# Patient Record
Sex: Male | Born: 1955 | Race: White | Hispanic: No | Marital: Married | Smoking: Former smoker
Health system: Southern US, Community
[De-identification: ages and names within clinical notes are randomized; demographics above are authoritative.]

## PROBLEM LIST (undated history)

## (undated) DIAGNOSIS — I351 Nonrheumatic aortic (valve) insufficiency: Secondary | ICD-10-CM

## (undated) DIAGNOSIS — L989 Disorder of the skin and subcutaneous tissue, unspecified: Secondary | ICD-10-CM

## (undated) DIAGNOSIS — R609 Edema, unspecified: Secondary | ICD-10-CM

## (undated) DIAGNOSIS — I10 Essential (primary) hypertension: Secondary | ICD-10-CM

## (undated) DIAGNOSIS — K5730 Diverticulosis of large intestine without perforation or abscess without bleeding: Secondary | ICD-10-CM

## (undated) DIAGNOSIS — R001 Bradycardia, unspecified: Secondary | ICD-10-CM

## (undated) DIAGNOSIS — H9313 Tinnitus, bilateral: Secondary | ICD-10-CM

## (undated) HISTORY — DX: Essential (primary) hypertension: I10

## (undated) HISTORY — DX: Nonrheumatic aortic (valve) insufficiency: I35.1

## (undated) HISTORY — DX: Bradycardia, unspecified: R00.1

## (undated) HISTORY — DX: Diverticulosis of large intestine without perforation or abscess without bleeding: K57.30

## (undated) HISTORY — PX: APPENDECTOMY: SHX54

## (undated) HISTORY — PX: SQUAMOUS CELL CARCINOMA EXCISION: SHX2433

## (undated) HISTORY — DX: Tinnitus, bilateral: H93.13

## (undated) HISTORY — PX: MOLE REMOVAL: SHX2046

## (undated) HISTORY — PX: TONSILLECTOMY AND ADENOIDECTOMY: SHX28

## (undated) HISTORY — DX: Edema, unspecified: R60.9

## (undated) HISTORY — DX: Disorder of the skin and subcutaneous tissue, unspecified: L98.9

---

## 2004-04-17 ENCOUNTER — Ambulatory Visit: Payer: Self-pay | Admitting: Internal Medicine

## 2004-05-13 ENCOUNTER — Ambulatory Visit: Payer: Self-pay | Admitting: Internal Medicine

## 2004-05-17 ENCOUNTER — Ambulatory Visit: Payer: Self-pay | Admitting: Internal Medicine

## 2004-10-24 ENCOUNTER — Ambulatory Visit: Payer: Self-pay | Admitting: Internal Medicine

## 2006-07-10 ENCOUNTER — Ambulatory Visit: Payer: Self-pay | Admitting: Internal Medicine

## 2006-11-13 ENCOUNTER — Ambulatory Visit: Payer: Self-pay | Admitting: Internal Medicine

## 2006-11-13 LAB — CONVERTED CEMR LAB
ALT: 26 units/L (ref 0–53)
AST: 19 units/L (ref 0–37)
Albumin: 3.6 g/dL (ref 3.5–5.2)
Alkaline Phosphatase: 85 units/L (ref 39–117)
BUN: 16 mg/dL (ref 6–23)
Basophils Absolute: 0 10*3/uL (ref 0.0–0.1)
Basophils Relative: 0.6 % (ref 0.0–1.0)
Bilirubin, Direct: 0.2 mg/dL (ref 0.0–0.3)
Blood in Urine, dipstick: NEGATIVE
CO2: 29 meq/L (ref 19–32)
Calcium: 9.4 mg/dL (ref 8.4–10.5)
Chloride: 105 meq/L (ref 96–112)
Cholesterol: 157 mg/dL (ref 0–200)
Creatinine, Ser: 1.1 mg/dL (ref 0.4–1.5)
Eosinophils Absolute: 0.2 10*3/uL (ref 0.0–0.6)
Eosinophils Relative: 3.3 % (ref 0.0–5.0)
GFR calc Af Amer: 91 mL/min
GFR calc non Af Amer: 75 mL/min
Glucose, Bld: 93 mg/dL (ref 70–99)
Glucose, Urine, Semiquant: NEGATIVE
HCT: 44.3 % (ref 39.0–52.0)
HDL: 31.3 mg/dL — ABNORMAL LOW (ref >39.0)
Hemoglobin: 15.5 g/dL (ref 13.0–17.0)
Hep B Core Total Ab: NEGATIVE
Hep B S Ab: POSITIVE — AB
Hepatitis B Surface Ag: NEGATIVE
Ketones, urine, test strip: NEGATIVE
LDL Cholesterol: 116 mg/dL — ABNORMAL HIGH (ref 0–99)
Lymphocytes Relative: 25 % (ref 12.0–46.0)
MCHC: 35.1 g/dL (ref 30.0–36.0)
MCV: 93.4 fL (ref 78.0–100.0)
Monocytes Absolute: 0.4 10*3/uL (ref 0.2–0.7)
Monocytes Relative: 7.4 % (ref 3.0–11.0)
Neutro Abs: 3.2 10*3/uL (ref 1.4–7.7)
Neutrophils Relative %: 63.7 % (ref 43.0–77.0)
Nitrite: NEGATIVE
PSA: 0.68 ng/mL (ref 0.10–4.00)
Platelets: 205 10*3/uL (ref 150–400)
Potassium: 4.3 meq/L (ref 3.5–5.1)
RBC: 4.74 M/uL (ref 4.22–5.81)
RDW: 12.2 % (ref 11.5–14.6)
Sodium: 141 meq/L (ref 135–145)
Specific Gravity, Urine: >=1.03
TSH: 1.72 microintl units/mL (ref 0.35–5.50)
Testosterone: 421.23 ng/dL (ref 350.00–890)
Total Bilirubin: 0.9 mg/dL (ref 0.3–1.2)
Total CHOL/HDL Ratio: 5
Total Protein: 6.4 g/dL (ref 6.0–8.3)
Triglycerides: 49 mg/dL (ref 0–149)
Urobilinogen, UA: 0.2
VLDL: 10 mg/dL (ref 0–40)
WBC Urine, dipstick: NEGATIVE
WBC: 5 10*3/uL (ref 4.5–10.5)
pH: 5

## 2006-11-20 ENCOUNTER — Ambulatory Visit: Payer: Self-pay | Admitting: Internal Medicine

## 2006-11-20 DIAGNOSIS — R413 Other amnesia: Secondary | ICD-10-CM | POA: Insufficient documentation

## 2006-12-04 ENCOUNTER — Ambulatory Visit: Payer: Self-pay | Admitting: Gastroenterology

## 2006-12-14 ENCOUNTER — Encounter: Payer: Self-pay | Admitting: Internal Medicine

## 2006-12-14 ENCOUNTER — Ambulatory Visit: Payer: Self-pay | Admitting: Gastroenterology

## 2006-12-14 HISTORY — PX: COLONOSCOPY: SHX174

## 2007-01-09 ENCOUNTER — Encounter: Admission: RE | Admit: 2007-01-09 | Discharge: 2007-01-09 | Payer: Self-pay | Admitting: Neurology

## 2007-04-01 ENCOUNTER — Telehealth: Payer: Self-pay | Admitting: Internal Medicine

## 2007-04-01 ENCOUNTER — Ambulatory Visit: Payer: Self-pay | Admitting: Internal Medicine

## 2007-04-01 DIAGNOSIS — M25579 Pain in unspecified ankle and joints of unspecified foot: Secondary | ICD-10-CM | POA: Insufficient documentation

## 2008-01-06 ENCOUNTER — Ambulatory Visit: Payer: Self-pay | Admitting: Internal Medicine

## 2008-01-06 DIAGNOSIS — R109 Unspecified abdominal pain: Secondary | ICD-10-CM | POA: Insufficient documentation

## 2008-01-06 LAB — CONVERTED CEMR LAB
Rubeola IgG: 2.73 — ABNORMAL HIGH
Varicella IgG: 5.55 — ABNORMAL HIGH

## 2008-01-07 LAB — CONVERTED CEMR LAB
AST: 23 units/L (ref 0–37)
Albumin: 3.8 g/dL (ref 3.5–5.2)
Alkaline Phosphatase: 81 units/L (ref 39–117)
Amylase: 58 units/L (ref 27–131)
Bilirubin, Direct: 0.1 mg/dL (ref 0.0–0.3)
CO2: 30 meq/L (ref 19–32)
Creatinine, Ser: 1.2 mg/dL (ref 0.4–1.5)
Eosinophils Absolute: 0.1 10*3/uL (ref 0.0–0.7)
GFR calc non Af Amer: 68 mL/min
Monocytes Relative: 7.8 % (ref 3.0–12.0)
Neutrophils Relative %: 65.1 % (ref 43.0–77.0)
Potassium: 4.1 meq/L (ref 3.5–5.1)
RDW: 12.2 % (ref 11.5–14.6)
Sodium: 142 meq/L (ref 135–145)
Total Bilirubin: 0.6 mg/dL (ref 0.3–1.2)
WBC: 4.6 10*3/uL (ref 4.5–10.5)

## 2008-01-11 ENCOUNTER — Ambulatory Visit: Payer: Self-pay | Admitting: Internal Medicine

## 2008-12-25 ENCOUNTER — Ambulatory Visit: Payer: Self-pay | Admitting: Internal Medicine

## 2008-12-25 DIAGNOSIS — K5730 Diverticulosis of large intestine without perforation or abscess without bleeding: Secondary | ICD-10-CM | POA: Insufficient documentation

## 2011-02-13 ENCOUNTER — Ambulatory Visit (INDEPENDENT_AMBULATORY_CARE_PROVIDER_SITE_OTHER): Payer: BC Managed Care – PPO | Admitting: Family

## 2011-02-13 ENCOUNTER — Encounter: Payer: Self-pay | Admitting: Family

## 2011-02-13 VITALS — BP 118/78 | HR 67 | Resp 14 | Wt 199.0 lb

## 2011-02-13 DIAGNOSIS — H9319 Tinnitus, unspecified ear: Secondary | ICD-10-CM

## 2011-02-13 DIAGNOSIS — H9311 Tinnitus, right ear: Secondary | ICD-10-CM

## 2011-02-13 NOTE — Progress Notes (Signed)
  Subjective:    Patient ID: Andrew Mckay, male    DOB: 02/04/1955, 56 y.o.   MRN: [id number]  HPI 56 year old white male, nonsmoker, patient of doctors for is in today with complaints of ringing in his ears has been consistent since 01/26/2011. He is unsure of its both ears but believes history right ear is ringing. He has an occasional headache that is dull, and annoying, rates it a 2/10 that comes and goes but is unsure if the headache is associated with ringing in ears. He denies any sneezing, cough, congestion, vertigo, lightheadedness, and dizziness. No past medical history of any ear issues as an adult. As a child he had frequent otitis media.   Review of Systems  Constitutional: Negative.   HENT: Positive for tinnitus. Negative for hearing loss, congestion, rhinorrhea, sneezing, postnasal drip and ear discharge.   Eyes: Negative.   Respiratory: Negative.   Cardiovascular: Negative.   Musculoskeletal: Negative.   Skin: Negative.   Neurological: Negative.  Negative for dizziness, weakness, light-headedness and numbness.  Hematological: Negative.   Psychiatric/Behavioral: Negative.    No past medical history on file.  History   Social History  . Marital Status: Married    Spouse Name: N/A    Number of Children: N/A  . Years of Education: N/A   Occupational History  . Not on file.   Social History Main Topics  . Smoking status: Former Games developer  . Smokeless tobacco: Not on file  . Alcohol Use: Not on file  . Drug Use: Not on file  . Sexually Active: Not on file   Other Topics Concern  . Not on file   Social History Narrative  . No narrative on file    No past surgical history on file.  No family history on file.  No Known Allergies  No current outpatient prescriptions on file prior to visit.    BP 118/78  Pulse 67  Resp 14  Wt 199 lb (90.266 kg)  SpO2 96%chart    Objective:   Physical Exam  Constitutional: He is oriented to person, place, and time. He  appears well-developed and well-nourished.  HENT:  Right Ear: External ear normal.  Nose: Nose normal.  Mouth/Throat: Oropharynx is clear and moist.       Small amount of fluid noted to the left tympanic membrane  Eyes: Conjunctivae are normal.  Neck: Normal range of motion. Neck supple.  Cardiovascular: Normal rate, regular rhythm and normal heart sounds.   Pulmonary/Chest: Effort normal and breath sounds normal.  Musculoskeletal: Normal range of motion.  Neurological: He is alert and oriented to person, place, and time.  Skin: Skin is warm and dry.  Psychiatric: He has a normal mood and affect.          Assessment & Plan:  Assessment: Tinnitus, eustachian tube dysfunction  Plan: Zyrtec-D or Claritin-D twice daily. Patient will call the office in one week to let us know if the ringing has subsided. I suspect that this is an inner ear problem, will probably need referral to ENT for further management. We'll follow the patient in a week and make a decision from there.

## 2011-02-13 NOTE — Patient Instructions (Signed)
1. Claritin-D or Zyrtec-D twice daily. 2. Call the office if ringing continues in 1 week.   Tinnitus Sounds you hear in your ears and coming from within the ear is called tinnitus. This can be a symptom of many ear disorders. It is often associated with hearing loss.  Tinnitus can be seen with:  Infections.   Ear blockages such as wax buildup.   Meniere's disease.   Ear damage.   Inherited.   Occupational causes.  While irritating, it is not usually a threat to health. When the cause of the tinnitus is wax, infection in the middle ear, or foreign body it is easily treated. Hearing loss will usually be reversible.  TREATMENT  When treating the underlying cause does not get rid of tinnitus, it may be necessary to get rid of the unwanted sound by covering it up with more pleasant background noises. This may include music, the radio etc. There are tinnitus maskers which can be worn which produce background noise to cover up the tinnitus. Avoid all medications which tend to make tinnitus worse such as alcohol, caffeine, aspirin, and nicotine. There are many soothing background tapes such as rain, ocean, thunderstorms, etc. These soothing sounds help with sleeping or resting. Keep all follow-up appointments and referrals. This is important to identify the cause of the problem. It also helps avoid complications, impaired hearing, disability, or chronic pain. Document Released: 01/06/2005 Document Revised: 09/18/2010 Document Reviewed: 08/25/2007 [facility] Patient Information 2012 [place], [place].

## 2011-03-13 ENCOUNTER — Ambulatory Visit (INDEPENDENT_AMBULATORY_CARE_PROVIDER_SITE_OTHER): Payer: BC Managed Care – PPO | Admitting: Internal Medicine

## 2011-03-13 ENCOUNTER — Encounter: Payer: Self-pay | Admitting: Internal Medicine

## 2011-03-13 VITALS — BP 122/80 | HR 64 | Temp 98.2°F | Ht 74.0 in | Wt 198.0 lb

## 2011-03-13 DIAGNOSIS — Z23 Encounter for immunization: Secondary | ICD-10-CM

## 2011-03-13 DIAGNOSIS — Z0000 Encounter for general adult medical examination without abnormal findings: Secondary | ICD-10-CM

## 2011-03-13 LAB — LIPID PANEL
Cholesterol: 144 mg/dL (ref 0–200)
Triglycerides: 92 mg/dL (ref 0.0–149.0)
VLDL: 18.4 mg/dL (ref 0.0–40.0)

## 2011-03-13 LAB — HEPATIC FUNCTION PANEL
ALT: 26 U/L (ref 0–53)
Albumin: 3.8 g/dL (ref 3.5–5.2)
Alkaline Phosphatase: 80 U/L (ref 39–117)
Total Bilirubin: 0.5 mg/dL (ref 0.3–1.2)

## 2011-03-13 LAB — POCT URINALYSIS DIPSTICK
Bilirubin, UA: NEGATIVE
Blood, UA: NEGATIVE
Glucose, UA: NEGATIVE
Ketones, UA: NEGATIVE
Leukocytes, UA: NEGATIVE
Nitrite, UA: NEGATIVE
Spec Grav, UA: >=1.03
Urobilinogen, UA: 0.2
pH, UA: 5.5

## 2011-03-13 LAB — CBC WITH DIFFERENTIAL/PLATELET
Basophils Absolute: 0 10*3/uL (ref 0.0–0.1)
Hemoglobin: 15.7 g/dL (ref 13.0–17.0)
MCHC: 34.7 g/dL (ref 30.0–36.0)
MCV: 94 fl (ref 78.0–100.0)
Monocytes Absolute: 0.4 10*3/uL (ref 0.1–1.0)
Neutro Abs: 3.1 10*3/uL (ref 1.4–7.7)
Neutrophils Relative %: 66.1 % (ref 43.0–77.0)
RDW: 14 % (ref 11.5–14.6)
WBC: 4.7 10*3/uL (ref 4.5–10.5)

## 2011-03-13 LAB — BASIC METABOLIC PANEL
Calcium: 9 mg/dL (ref 8.4–10.5)
Glucose, Bld: 89 mg/dL (ref 70–99)
Potassium: 4.5 mEq/L (ref 3.5–5.1)
Sodium: 143 mEq/L (ref 135–145)

## 2011-03-13 LAB — PSA: PSA: 1.81 ng/mL (ref 0.10–4.00)

## 2011-03-13 LAB — TSH: TSH: 1.72 u[IU]/mL (ref 0.35–5.50)

## 2011-03-13 NOTE — Progress Notes (Signed)
  Subjective:    Patient ID: Andrew Mckay, male    DOB: 1955-07-07, 56 y.o.   MRN: [id number]  HPI  cpx  Past Medical History  Diagnosis Date  . Diverticulosis of colon    No past surgical history on file.  reports that he has quit smoking. He does not have any smokeless tobacco history on file. His alcohol and drug histories not on file. family history is not on file.  He is adopted. No Known Allergies   Review of Systems  patient denies chest pain, shortness of breath, orthopnea. Denies lower extremity edema, abdominal pain, change in appetite, change in bowel movements. Patient denies rashes, musculoskeletal complaints. No other specific complaints in a complete review of systems.      Objective:   Physical Exam Well-developed male in no acute distress. HEENT exam atraumatic, normocephalic, extraocular muscles are intact. Conjunctivae are pink without exudate. Neck is supple without lymphadenopathy, thyromegaly, jugular venous distention. Chest is clear to auscultation without increased work of breathing. Cardiac exam S1-S2 are regular. The PMI is normal. No significant murmurs or gallops. Abdominal exam active bowel sounds, soft, nontender. No abdominal bruits. Extremities no clubbing cyanosis or edema. Peripheral pulses are normal without bruits. Neurologic exam alert and oriented without any motor or sensory deficits. Rectal exam normal tone prostate normal size without masses or asymmetry.     Assessment & Plan:  Well visit: health maint UTD tdap

## 2011-03-15 ENCOUNTER — Emergency Department (HOSPITAL_COMMUNITY)
Admission: EM | Admit: 2011-03-15 | Discharge: 2011-03-15 | Disposition: A | Discharge status: Home Health | Payer: BC Managed Care – PPO | Attending: Emergency Medicine | Admitting: Emergency Medicine

## 2011-03-15 ENCOUNTER — Emergency Department (HOSPITAL_COMMUNITY): Payer: BC Managed Care – PPO

## 2011-03-15 ENCOUNTER — Encounter (HOSPITAL_COMMUNITY): Payer: Self-pay | Admitting: Emergency Medicine

## 2011-03-15 DIAGNOSIS — S61409A Unspecified open wound of unspecified hand, initial encounter: Secondary | ICD-10-CM | POA: Insufficient documentation

## 2011-03-15 DIAGNOSIS — W260XXA Contact with knife, initial encounter: Secondary | ICD-10-CM | POA: Insufficient documentation

## 2011-03-15 DIAGNOSIS — S61419A Laceration without foreign body of unspecified hand, initial encounter: Secondary | ICD-10-CM

## 2011-03-15 MED ORDER — CEPHALEXIN 500 MG PO CAPS: 500.0000 mg | ORAL_CAPSULE | Freq: Four times a day (QID) | ORAL | Status: AC

## 2011-03-15 MED ORDER — KETOROLAC TROMETHAMINE 60 MG/2ML IM SOLN
INTRAMUSCULAR | Status: AC
  Filled 2011-03-15: qty 2

## 2011-03-15 MED ORDER — HYDROCODONE-ACETAMINOPHEN 5-325 MG PO TABS: 2.0000 | ORAL_TABLET | ORAL | Status: AC | PRN

## 2011-03-15 MED ORDER — LIDOCAINE HCL 2 % IJ SOLN
INTRAMUSCULAR | Status: AC
  Filled 2011-03-15: qty 1

## 2011-03-15 NOTE — ED Notes (Signed)
Working on motorcycle at home and was using a knife and he lacerated his left 3rd & 5th fingers.

## 2011-03-15 NOTE — Discharge Instructions (Signed)
Laceration Care, Adult     A laceration is a cut or lesion that goes through all layers of the skin and into the tissue just beneath the skin.  TREATMENT   Some lacerations may not require closure. Some lacerations may not be able to be closed due to an increased risk of infection. It is important to see your caregiver as soon as possible after an injury to minimize the risk of infection and maximize the opportunity for successful closure.  If closure is appropriate, pain medicines may be given, if needed. The wound will be cleaned to help prevent infection. Your caregiver will use stitches (sutures), staples, wound glue (adhesive), or skin adhesive strips to repair the laceration. These tools bring the skin edges together to allow for faster healing and a better cosmetic outcome. However, all wounds will heal with a scar. Once the wound has healed, scarring can be minimized by covering the wound with sunscreen during the day for 1 full year.  HOME CARE INSTRUCTIONS   For sutures or staples:  · Keep the wound clean and dry.   · If you were given a bandage (dressing), you should change it at least once a day. Also, change the dressing if it becomes wet or dirty, or as directed by your caregiver.   · Wash the wound with soap and water 2 times a day. Rinse the wound off with water to remove all soap. Pat the wound dry with a clean towel.   · After cleaning, apply a thin layer of the antibiotic ointment as recommended by your caregiver. This will help prevent infection and keep the dressing from sticking.   · You may shower as usual after the first 24 hours. Do not soak the wound in water until the sutures are removed.   · Only take over-the-counter or prescription medicines for pain, discomfort, or fever as directed by your caregiver.   · Get your sutures or staples removed as directed by your caregiver.   For skin adhesive strips:  · Keep the wound clean and dry.   · Do not get the skin adhesive strips wet. You may  bathe carefully, using caution to keep the wound dry.   · If the wound gets wet, pat it dry with a clean towel.   · Skin adhesive strips will fall off on their own. You may trim the strips as the wound heals. Do not remove skin adhesive strips that are still stuck to the wound. They will fall off in time.   For wound adhesive:  · You may briefly wet your wound in the shower or bath. Do not soak or scrub the wound. Do not swim. Avoid periods of heavy perspiration until the skin adhesive has fallen off on its own. After showering or bathing, gently pat the wound dry with a clean towel.   · Do not apply liquid medicine, cream medicine, or ointment medicine to your wound while the skin adhesive is in place. This may loosen the film before your wound is healed.   · If a dressing is placed over the wound, be careful not to apply tape directly over the skin adhesive. This may cause the adhesive to be pulled off before the wound is healed.   · Avoid prolonged exposure to sunlight or tanning lamps while the skin adhesive is in place. Exposure to ultraviolet light in the first year will darken the scar.   · The skin adhesive will usually remain in place for   5 to 10 days, then naturally fall off the skin. Do not pick at the adhesive film.   You may need a tetanus shot if:  · You cannot remember when you had your last tetanus shot.   · You have never had a tetanus shot.   If you get a tetanus shot, your arm may swell, get red, and feel warm to the touch. This is common and not a problem. If you need a tetanus shot and you choose not to have one, there is a rare chance of getting tetanus. Sickness from tetanus can be serious.  SEEK MEDICAL CARE IF:   · You have redness, swelling, or increasing pain in the wound.   · You see a red line that goes away from the wound.   · You have yellowish-white fluid (pus) coming from the wound.   · You have a fever.   · You notice a bad smell coming from the wound or dressing.   · Your wound  breaks open before or after sutures have been removed.   · You notice something coming out of the wound such as wood or glass.   · Your wound is on your hand or foot and you cannot move a finger or toe.   SEEK IMMEDIATE MEDICAL CARE IF:   · Your pain is not controlled with prescribed medicine.   · You have severe swelling around the wound causing pain and numbness or a change in color in your arm, hand, leg, or foot.   · Your wound splits open and starts bleeding.   · You have worsening numbness, weakness, or loss of function of any joint around or beyond the wound.   · You develop painful lumps near the wound or on the skin anywhere on your body.   MAKE SURE YOU:   · Understand these instructions.   · Will watch your condition.   · Will get help right away if you are not doing well or get worse.   Document Released: 01/06/2005 Document Revised: 09/18/2010 Document Reviewed: 07/02/2010  [employer]® Patient Information ©2012 [employer].

## 2011-03-15 NOTE — ED Provider Notes (Signed)
History     CSN: [id number]  Arrival date & time 03/15/11  4098   First MD Initiated Contact with Patient 03/15/11 1817      Chief Complaint  Patient presents with  . Laceration    (Consider location/radiation/quality/duration/timing/severity/associated sxs/prior treatment) HPI  Pt presents to the ED with complaints of laceration to fingers on left hand. The patient was working with a knife on his motorcycle when accidentally got his 3rd, and 5th digit. The bleeding is currently controlled. He denies being on blood thinners, he denies any other complaints or injuries.  Past Medical History  Diagnosis Date  . Diverticulosis of colon     History reviewed. No pertinent past surgical history.  Family History  Problem Relation Age of Onset  . Adopted: Yes    History  Substance Use Topics  . Smoking status: Former Games developer  . Smokeless tobacco: Not on file  . Alcohol Use: Not on file      Review of Systems  All other systems reviewed and are negative.    Allergies  Review of patient's allergies indicates no known allergies.  Home Medications   Current Outpatient Rx  Name Route Sig Dispense Refill  . CEPHALEXIN 500 MG PO CAPS Oral Take 1 capsule (500 mg total) by mouth 4 (four) times daily. 40 capsule 0  . HYDROCODONE-ACETAMINOPHEN 5-325 MG PO TABS Oral Take 2 tablets by mouth every 4 (four) hours as needed for pain. 6 tablet 0    BP 134/94  Pulse 78  Temp(Src) 99.1 F (37.3 C) (Oral)  Resp 18  Ht 6\' 2"  (1.88 m)  Wt 198 lb (89.812 kg)  BMI 25.42 kg/m2  SpO2 97%  Physical Exam  Nursing note and vitals reviewed. Constitutional: He appears well-developed and well-nourished. No distress.  HENT:  Head: Normocephalic and atraumatic.  Eyes: Pupils are equal, round, and reactive to light.  Neck: Normal range of motion. Neck supple.  Cardiovascular: Normal rate and regular rhythm.   Pulmonary/Chest: Effort normal.  Abdominal: Soft.  Musculoskeletal:   Left hand: He exhibits laceration. He exhibits normal range of motion, no tenderness, normal capillary refill, no deformity and no swelling. decreased sensation (the Distal portion of the 3rd finger has decreased sentaion) noted. Normal strength noted.       Hands: Neurological: He is alert.  Skin: Skin is warm and dry.    ED Course  Procedures (including critical care time)  Labs Reviewed - No data to display Dg Hand Complete Left  03/15/2011  *RADIOLOGY REPORT*  Clinical Data: 56 year old male with laceration.  Third digit numbness.  LEFT HAND - COMPLETE 3+ VIEW  Comparison: None.  Findings: No subcutaneous gas. No radiopaque foreign body identified.  Distal radius and ulna appear intact.  Incidental watch artifact.  Carpal bone alignment preserved.  No fracture or dislocation identified.  IMPRESSION: No acute osseous abnormality identified about the left hand.  Original Report Authenticated By: Harley Hallmark, M.D.     1. Hand laceration       MDM  LACERATION REPAIR Performed by: Dorthula Matas Authorized by: Dorthula Matas Consent: Verbal consent obtained. Risks and benefits: risks, benefits and alternatives were discussed Consent given by: patient Patient identity confirmed: provided demographic data Prepped and Draped in normal sterile fashion Wound explored  Laceration Location: left hand 3rd finger PIP anteroir joint  Laceration Length: 2.5cm  No Foreign Bodies seen or palpated  Anesthesia: local infiltration  Local anesthetic: lidocaine 2% wo epinephrine  Anesthetic total: 2 ml  Irrigation method: syringe Amount of cleaning: standard  Skin closure: sutures  Number of sutures: 5  Technique: simple interrupted  Patient tolerance: Patient tolerated the procedure well with no immediate complications. Marland Kitchen   LACERATION REPAIR Performed by: Dorthula Matas Authorized by: Dorthula Matas Consent: Verbal consent obtained. Risks and benefits: risks,  benefits and alternatives were discussed Consent given by: patient Patient identity confirmed: provided demographic data Prepped and Draped in normal sterile fashion Wound explored  Laceration Location: left hand 5th finger, PIP anterior joint  Laceration Length: 1 cm  No Foreign Bodies seen or palpated  Anesthesia: local infiltration  Local anesthetic: lidocaine 2% wo epinephrine  Anesthetic total: 2 ml  Irrigation method: syringe Amount of cleaning: standard  Skin closure: sutures  Number of sutures: 4  Technique: simple interrupted  Patient tolerance: Patient tolerated the procedure well with no immediate complications.   Pt received tetanus shot two days ago during PCP visit. Pt states the knife he used was not dirty. However, antibiotic (Keflex) given for prophylaxis as he was working with dirty metal. Finger splint placed on 3rd digit. Pt given 5mg  Lortab (6 pills for pain), pt given referral to hand with Dr. Izora Ribas.       Dorthula Matas, PA 03/15/11 2024

## 2011-03-16 NOTE — ED Provider Notes (Signed)
Medical screening examination/treatment/procedure(s) were performed by non-physician practitioner and as supervising physician I was immediately available for consultation/collaboration.  Arleigh Dicola R. Folashade Gamboa, MD 03/16/11 0008 

## 2012-04-05 ENCOUNTER — Other Ambulatory Visit (INDEPENDENT_AMBULATORY_CARE_PROVIDER_SITE_OTHER): Payer: Self-pay

## 2012-04-05 DIAGNOSIS — Z1159 Encounter for screening for other viral diseases: Secondary | ICD-10-CM

## 2012-04-05 DIAGNOSIS — Z0000 Encounter for general adult medical examination without abnormal findings: Secondary | ICD-10-CM

## 2012-04-05 NOTE — Addendum Note (Signed)
Addended by: Alfred Levins D on: 04/05/2012 04:09 PM   Modules accepted: Orders

## 2012-04-06 LAB — MEASLES/MUMPS/RUBELLA IMMUNITY
Rubella: 5.5 Index — ABNORMAL HIGH (ref <0.90)
Rubeola IgG: >300 AU/mL — ABNORMAL HIGH (ref <25.00)

## 2012-04-07 LAB — VARICELLA ZOSTER ANTIBODY, IGM: Varicella Zoster Ab IgM: 0.13 {ISR} (ref <0.91)

## 2012-04-08 LAB — TB SKIN TEST: TB Skin Test: NEGATIVE

## 2012-05-10 ENCOUNTER — Other Ambulatory Visit (INDEPENDENT_AMBULATORY_CARE_PROVIDER_SITE_OTHER): Payer: BC Managed Care – PPO

## 2012-05-10 DIAGNOSIS — Z0000 Encounter for general adult medical examination without abnormal findings: Secondary | ICD-10-CM

## 2012-05-10 LAB — PSA: PSA: 6.28 ng/mL — ABNORMAL HIGH (ref 0.10–4.00)

## 2012-05-10 LAB — HEPATIC FUNCTION PANEL
ALT: 28 U/L (ref 0–53)
AST: 20 U/L (ref 0–37)
Bilirubin, Direct: 0.1 mg/dL (ref 0.0–0.3)
Total Protein: 6.3 g/dL (ref 6.0–8.3)

## 2012-05-10 LAB — POCT URINALYSIS DIPSTICK
Bilirubin, UA: NEGATIVE
Ketones, UA: NEGATIVE
Leukocytes, UA: NEGATIVE
Spec Grav, UA: >=1.03
Urobilinogen, UA: 0.2
pH, UA: 5.5

## 2012-05-10 LAB — CBC WITH DIFFERENTIAL/PLATELET
Basophils Absolute: 0 10*3/uL (ref 0.0–0.1)
Eosinophils Absolute: 0.3 10*3/uL (ref 0.0–0.7)
MCHC: 34.9 g/dL (ref 30.0–36.0)
MCV: 91.5 fl (ref 78.0–100.0)
Monocytes Absolute: 0.4 10*3/uL (ref 0.1–1.0)
Neutro Abs: 3 10*3/uL (ref 1.4–7.7)
Platelets: 196 10*3/uL (ref 150.0–400.0)
RDW: 13.6 % (ref 11.5–14.6)

## 2012-05-10 LAB — BASIC METABOLIC PANEL
BUN: 18 mg/dL (ref 6–23)
CO2: 25 mEq/L (ref 19–32)
Calcium: 8.7 mg/dL (ref 8.4–10.5)
Chloride: 106 mEq/L (ref 96–112)
GFR: 65.79 mL/min (ref >60.00)
Potassium: 4.7 mEq/L (ref 3.5–5.1)

## 2012-05-10 LAB — LIPID PANEL: HDL: 31.7 mg/dL — ABNORMAL LOW (ref >39.00)

## 2012-05-10 LAB — TSH: TSH: 1.35 u[IU]/mL (ref 0.35–5.50)

## 2012-05-13 ENCOUNTER — Other Ambulatory Visit: Payer: Self-pay | Admitting: *Deleted

## 2012-05-13 MED ORDER — CIPROFLOXACIN HCL 500 MG PO TABS: 500.0000 mg | ORAL_TABLET | Freq: Two times a day (BID) | ORAL | Status: DC

## 2012-05-17 ENCOUNTER — Ambulatory Visit (INDEPENDENT_AMBULATORY_CARE_PROVIDER_SITE_OTHER): Payer: BC Managed Care – PPO | Admitting: Internal Medicine

## 2012-05-17 ENCOUNTER — Encounter: Payer: Self-pay | Admitting: Internal Medicine

## 2012-05-17 VITALS — BP 134/90 | HR 80 | Temp 98.0°F | Ht 74.5 in | Wt 200.0 lb

## 2012-05-17 DIAGNOSIS — N509 Disorder of male genital organs, unspecified: Secondary | ICD-10-CM

## 2012-05-17 DIAGNOSIS — Z0000 Encounter for general adult medical examination without abnormal findings: Secondary | ICD-10-CM

## 2012-05-17 DIAGNOSIS — N50811 Right testicular pain: Secondary | ICD-10-CM

## 2012-05-17 DIAGNOSIS — R972 Elevated prostate specific antigen [PSA]: Secondary | ICD-10-CM | POA: Insufficient documentation

## 2012-05-17 NOTE — Progress Notes (Signed)
Patient ID: Andrew Mckay, male   DOB: February 03, 1955, 57 y.o.   MRN: [id number] cpx  New complaints-- right sided abdominal pain for 3 months-- started with RLQ pain with radiation to right testicle. More recently pain has extended to periumbilical area.   Past Medical History  Diagnosis Date  . Diverticulosis of colon     History   Social History  . Marital Status: Married    Spouse Name: N/A    Number of Children: N/A  . Years of Education: N/A   Occupational History  . Not on file.   Social History Main Topics  . Smoking status: Former Games developer  . Smokeless tobacco: Not on file  . Alcohol Use: Not on file  . Drug Use: Not on file  . Sexually Active: Not on file   Other Topics Concern  . Not on file   Social History Narrative  . No narrative on file    No past surgical history on file.  Family History  Problem Relation Age of Onset  . Adopted: Yes    No Known Allergies  Current Outpatient Prescriptions on File Prior to Visit  Medication Sig Dispense Refill  . ciprofloxacin (CIPRO) 500 MG tablet Take 1 tablet (500 mg total) by mouth 2 (two) times daily.  20 tablet  0   No current facility-administered medications on file prior to visit.     patient denies chest pain, shortness of breath, orthopnea. Denies lower extremity edema, abdominal pain, change in appetite, change in bowel movements. Patient denies rashes, musculoskeletal complaints. No other specific complaints in a complete review of systems.   BP 134/90  Pulse 80  Temp(Src) 98 F (36.7 C) (Oral)  Ht 6' 2.5" (1.892 m)  Wt 200 lb (90.719 kg)  BMI 25.34 kg/m2   Well-developed male in no acute distress. HEENT exam atraumatic, normocephalic, extraocular muscles are intact. Conjunctivae are pink without exudate. Neck is supple without lymphadenopathy, thyromegaly, jugular venous distention. Chest is clear to auscultation without increased work of breathing. Cardiac exam S1-S2 are regular. The PMI is normal.  No significant murmurs or gallops. Abdominal exam active bowel sounds, soft, nontender. No abdominal bruits. Extremities no clubbing cyanosis or edema. Peripheral pulses are normal without bruits. Neurologic exam alert and oriented without any motor or sensory deficits. Rectal exam normal tone prostate enlarged without masses or asymmetry. No obvious hernia but right testicle is elevated.  Well visit- health maint UTD  RLQ pain-- suspect related to abnormal testicle position-- refer urology  Elevated psa-- cipro - recheck in 30 days.

## 2012-05-24 DIAGNOSIS — R1031 Right lower quadrant pain: Secondary | ICD-10-CM | POA: Insufficient documentation

## 2012-05-27 ENCOUNTER — Other Ambulatory Visit: Payer: BC Managed Care – PPO

## 2012-06-04 ENCOUNTER — Encounter: Payer: BC Managed Care – PPO | Admitting: Internal Medicine

## 2012-06-16 ENCOUNTER — Other Ambulatory Visit (INDEPENDENT_AMBULATORY_CARE_PROVIDER_SITE_OTHER): Payer: BC Managed Care – PPO

## 2012-06-16 DIAGNOSIS — R972 Elevated prostate specific antigen [PSA]: Secondary | ICD-10-CM

## 2012-06-29 ENCOUNTER — Encounter: Payer: Self-pay | Admitting: Internal Medicine

## 2012-06-29 ENCOUNTER — Ambulatory Visit (INDEPENDENT_AMBULATORY_CARE_PROVIDER_SITE_OTHER): Payer: BC Managed Care – PPO | Admitting: Internal Medicine

## 2012-06-29 VITALS — BP 146/87 | Temp 98.7°F | Wt 197.0 lb

## 2012-06-29 DIAGNOSIS — R1031 Right lower quadrant pain: Secondary | ICD-10-CM

## 2012-07-01 NOTE — Assessment & Plan Note (Signed)
Unclear etiology He needs further evaluation for progressive pain i worry about a mass, possible kidney stone, could be "walled" off abscess.  Given the duration he needs further evaluation

## 2012-07-01 NOTE — Progress Notes (Signed)
Patient ID: Andrew Mckay, male   DOB: 1956-01-14, 57 y.o.   MRN: [id number] Now several weeks of severe (at times) RLQ abdominal pain. Pain is intermittent and limited to RLQ. No fever but at times diaphoretic.  No hematuria, no gi bleeding known Appetite has been ok Elevated psa- evaluated by urology  Reviewed pmh, psh, sochx  Reviewed meds   well-developed well-nourished male in no acute distress. HEENT exam atraumatic, normocephalic, neck supple without jugular venous distention. Chest clear to auscultation cardiac exam S1-S2 are regular. Abdominal exam with active  bowel sounds, soft and tender to palpation. Extremities no edema. Neurologic exam is alert with a normal gait.

## 2013-05-31 ENCOUNTER — Telehealth: Payer: Self-pay | Admitting: Internal Medicine

## 2013-05-31 NOTE — Telephone Encounter (Signed)
Noted  

## 2013-05-31 NOTE — Telephone Encounter (Signed)
Patient Information:  Caller Name: Andrew Mckay  Phone: [phone]  Patient: Andrew Mckay, Andrew Mckay  Gender: Male  DOB: 07-03-1955  Age: 58 Years  PCP: Birdie SonsSwords, Bruce (Adults only)  Office Follow Up:  Does the office need to follow up with this patient?: No  Instructions For The Office: N/A   Symptoms  Reason For Call & Symptoms: Andrew Mckay states he has had constant abdominal pain x one year. Rates pain a 2  to 4 on 1/10 pt scale. States he has generalized umbilical area abdominal pain. States stools are consistency of cake frosting x several months. States "toilet water turns smoky when toilet is flushed". Has had intermittent problems urinating but none at present. Intermittent urinary frequency. Per abdominal pain male protocol has go to ED now or Office with PCP approval. Spoke with [place] in office . No appointments available in office today 05/31/13. May schedule in office for 06/01/13 or go to ED. Andrew Mckay does not think he needs to go to ED. Appointment scheduled in [facility] for 06/01/13 at 09:30 with Dr. Clent RidgesFry.  Reviewed Health History In EMR: Yes  Reviewed Medications In EMR: Yes  Reviewed Allergies In EMR: Yes  Reviewed Surgeries / Procedures: Yes  Date of Onset of Symptoms: 06/03/2012  Guideline(s) Used:  Abdominal Pain - Male  Disposition Per Guideline:   Go to ED Now (or to Office with PCP Approval)  Reason For Disposition Reached:   Constant abdominal pain lasting > 2 hours  Advice Given:  N/A  Patient Will Follow Care Advice:  YES  Appointment Scheduled:  06/01/2013 09:30:00 Appointment Scheduled Provider:  Gershon CraneFry, [facility] Practice)

## 2013-06-01 ENCOUNTER — Encounter: Payer: Self-pay | Admitting: Family Medicine

## 2013-06-01 ENCOUNTER — Ambulatory Visit (INDEPENDENT_AMBULATORY_CARE_PROVIDER_SITE_OTHER): Payer: BC Managed Care – PPO | Admitting: Family Medicine

## 2013-06-01 VITALS — BP 150/100 | HR 69 | Temp 98.5°F | Wt 198.0 lb

## 2013-06-01 DIAGNOSIS — R1031 Right lower quadrant pain: Secondary | ICD-10-CM

## 2013-06-01 NOTE — Progress Notes (Signed)
Pre visit review using our clinic review tool, if applicable. No additional management support is needed unless otherwise documented below in the visit note. 

## 2013-06-01 NOTE — Progress Notes (Signed)
   Subjective:    Patient ID: Andrew Mckay, male    DOB: February 27, 1955, 58 y.o.   MRN: [id number]  HPI Here for chronic abdominal pains and loose stools. For several years he has had frequent RLQ crampy pains and now for the past one year he has had pasty or loose stools with a lot of mucus production. Often after a BM he will have seepage of clear mucus from the anus for several hours. No fevers, no nausea, no weight changes in the past year. He had a clear colonoscopy in 2008.    Review of Systems  Constitutional: Negative.   Gastrointestinal: Positive for abdominal pain. Negative for nausea, vomiting, diarrhea, constipation, blood in stool, abdominal distention, anal bleeding and rectal pain.       Objective:   Physical Exam  Constitutional: He appears well-developed and well-nourished.  Abdominal: Soft. Bowel sounds are normal. He exhibits no distension and no mass. There is no tenderness. There is no rebound and no guarding.          Assessment & Plan:  This sounds like IBS but we will refer to GI to evaluate.

## 2013-06-06 ENCOUNTER — Telehealth: Payer: Self-pay | Admitting: Gastroenterology

## 2013-06-06 NOTE — Telephone Encounter (Signed)
Pt requests to be seen sooner than 1st available for abdominal pain. Pt scheduled to see Willette ClusterPaula Guenther NP tomorrow at 3pm. Pt aware of appt.

## 2013-06-07 ENCOUNTER — Ambulatory Visit (INDEPENDENT_AMBULATORY_CARE_PROVIDER_SITE_OTHER): Payer: BC Managed Care – PPO | Admitting: Nurse Practitioner

## 2013-06-07 ENCOUNTER — Other Ambulatory Visit (INDEPENDENT_AMBULATORY_CARE_PROVIDER_SITE_OTHER): Payer: BC Managed Care – PPO

## 2013-06-07 ENCOUNTER — Encounter: Payer: Self-pay | Admitting: Nurse Practitioner

## 2013-06-07 ENCOUNTER — Ambulatory Visit (INDEPENDENT_AMBULATORY_CARE_PROVIDER_SITE_OTHER)
Admission: RE | Admit: 2013-06-07 | Discharge: 2013-06-07 | Disposition: A | Discharge status: Home / Self Care | Payer: BC Managed Care – PPO | Source: Ambulatory Visit | Attending: Nurse Practitioner | Admitting: Nurse Practitioner

## 2013-06-07 VITALS — BP 130/90 | HR 66 | Ht 74.5 in | Wt 199.8 lb

## 2013-06-07 DIAGNOSIS — Z87898 Personal history of other specified conditions: Secondary | ICD-10-CM

## 2013-06-07 DIAGNOSIS — R194 Change in bowel habit: Secondary | ICD-10-CM

## 2013-06-07 DIAGNOSIS — R1031 Right lower quadrant pain: Secondary | ICD-10-CM

## 2013-06-07 DIAGNOSIS — G8929 Other chronic pain: Secondary | ICD-10-CM

## 2013-06-07 DIAGNOSIS — R198 Other specified symptoms and signs involving the digestive system and abdomen: Secondary | ICD-10-CM

## 2013-06-07 DIAGNOSIS — Z87438 Personal history of other diseases of male genital organs: Secondary | ICD-10-CM

## 2013-06-07 LAB — CBC WITH DIFFERENTIAL/PLATELET
BASOS ABS: 0.1 10*3/uL (ref 0.0–0.1)
BASOS PCT: 0.9 % (ref 0.0–3.0)
EOS PCT: 3.2 % (ref 0.0–5.0)
Eosinophils Absolute: 0.2 10*3/uL (ref 0.0–0.7)
HEMATOCRIT: 45.1 % (ref 39.0–52.0)
HEMOGLOBIN: 15.3 g/dL (ref 13.0–17.0)
LYMPHS ABS: 1.6 10*3/uL (ref 0.7–4.0)
LYMPHS PCT: 27.9 % (ref 12.0–46.0)
MCHC: 34 g/dL (ref 30.0–36.0)
MCV: 94.1 fl (ref 78.0–100.0)
MONOS PCT: 8 % (ref 3.0–12.0)
Monocytes Absolute: 0.4 10*3/uL (ref 0.1–1.0)
Neutro Abs: 3.3 10*3/uL (ref 1.4–7.7)
Neutrophils Relative %: 60 % (ref 43.0–77.0)
Platelets: 230 10*3/uL (ref 150.0–400.0)
RBC: 4.79 Mil/uL (ref 4.22–5.81)
RDW: 13.3 % (ref 11.5–15.5)
WBC: 5.6 10*3/uL (ref 4.0–10.5)

## 2013-06-07 LAB — COMPREHENSIVE METABOLIC PANEL
ALT: 20 U/L (ref 0–53)
AST: 18 U/L (ref 0–37)
Albumin: 3.9 g/dL (ref 3.5–5.2)
Alkaline Phosphatase: 74 U/L (ref 39–117)
BILIRUBIN TOTAL: 0.9 mg/dL (ref 0.2–1.2)
BUN: 14 mg/dL (ref 6–23)
CALCIUM: 9.1 mg/dL (ref 8.4–10.5)
CHLORIDE: 106 meq/L (ref 96–112)
CO2: 28 mEq/L (ref 19–32)
CREATININE: 1.2 mg/dL (ref 0.4–1.5)
GFR: 63.71 mL/min (ref >60.00)
GLUCOSE: 90 mg/dL (ref 70–99)
Potassium: 4.1 mEq/L (ref 3.5–5.1)
Sodium: 139 mEq/L (ref 135–145)
Total Protein: 6.7 g/dL (ref 6.0–8.3)

## 2013-06-07 LAB — URINALYSIS
Hgb urine dipstick: NEGATIVE
Ketones, ur: NEGATIVE
Leukocytes, UA: NEGATIVE
NITRITE: NEGATIVE
PH: 5.5 (ref 5.0–8.0)
Specific Gravity, Urine: >=1.03 — AB (ref 1.000–1.030)
TOTAL PROTEIN, URINE-UPE24: NEGATIVE
Urine Glucose: NEGATIVE
Urobilinogen, UA: 0.2 (ref 0.0–1.0)

## 2013-06-07 LAB — SEDIMENTATION RATE: SED RATE: 13 mm/h (ref 0–22)

## 2013-06-07 LAB — PSA: PSA: 1.74 ng/mL (ref 0.10–4.00)

## 2013-06-07 LAB — HIGH SENSITIVITY CRP: CRP, High Sensitivity: 1.57 mg/L (ref 0.000–5.000)

## 2013-06-07 NOTE — Progress Notes (Signed)
    HPI :  Patient is a 58 year old male known to Dr. Ardis Hughs from screening colonoscopy in 2008. He comes in today for evaluation of RLQ pain located just above right iliac crest with radiation downward, almost into groin. This RLQ pain occurs after prolonged periods of driving or just sitting. It helps to get up and move around or lay flat. There is no relationship between pain and eating or bowel movements.The pain started a year ago at which time patient was evaluated by PCP and urology. He recalls being examined and negative for inguinal hernia.His PSA was elevated PSAbut after a course of antibiotics it normalized. He voids frequently, may have some hesitancy but these are chronic symptoms. No dysuria or hematuria.  After the course of antibiotics a year ago patient's bowel movements changed consistency. Stools became soft rather than solid and formed. Patient not bothered by this, just though he should mention it. No rectal bleeding but often mucoid. No weight loss. He has noticed pain in joints of both hands over last six months.   Past Medical History  Diagnosis Date  . Diverticulosis of colon   . Tinnitus of both ears      Family History  Problem Relation Age of Onset  . Adopted: Yes   History  Substance Use Topics  . Smoking status: Former Research scientist (life sciences)  . Smokeless tobacco: Never Used  . Alcohol Use: Yes     Comment: occ   No current outpatient prescriptions on file.   No current facility-administered medications for this visit.   No Known Allergies   Review of Systems: All systems reviewed and negative except where noted in HPI.   Physical Exam: BP 130/90  Pulse 66  Ht 6' 2.5" (1.892 m)  Wt 199 lb 12.8 oz (90.629 kg)  BMI 25.32 kg/m2 Constitutional: Pleasant,well-developed, white  male in no acute distress. HEENT: Normocephalic and atraumatic. Conjunctivae are normal. No scleral icterus. Neck supple.  Cardiovascular: Normal rate, regular rhythm.  Pulmonary/chest:  Effort normal and breath sounds normal. No wheezing, rales or rhonchi. Abdominal: Soft, nondistended, nontender. Bowel sounds active throughout. There are no masses palpable. No hepatomegaly.  Musculoskeletal: no right hip tenderness to palpation Extremities: no edema Lymphadenopathy: No cervical adenopathy noted. Neurological: Alert and oriented to person place and time. Skin: Skin is warm and dry. No rashes noted. Psychiatric: Normal mood and affect. Behavior is normal.   ASSESSMENT AND PLAN:   58 year old male with a one year history of RLQ pain radiating down toward groin. Saw urology for this a year ago but no etiology found though he was treated with antibiotics for elevated PSA.  Pain seems musculoskeletal as it only occurs after prolonged sitting / driving and resolves after moving around or lying flat. Having said that, patient has had a bowel change over the last year and he complains of joint pains n hands. Doubtful these things are related but will keep in mind. Patient nontender on exam but interesting when I asked him to show me area of frequent pain he placed finger directly over old appendectomy scar. Symptoms atypical for adhesive disease but something to consider. Will obtain CMET, CBC , UA, CRP, ESR, and he is requesting PSA (this is reasonable). Will also obtain xray of right hip. He should follow up with Dr. Ardis Hughs in 4-6 weeks. Will call him in interim with test results

## 2013-06-07 NOTE — Patient Instructions (Addendum)
Please go to the basement level to have your labs drawn and a Urine test. Also go to the radiology department basement level. We will get back to you with your lab and x-ray results. Your follow up appointment with Dr. Christella HartiganJacobs on 7-15 at 3:45.  Please call us sooner if you are having problems.

## 2013-06-08 NOTE — Progress Notes (Signed)
i agree with the plan above 

## 2013-08-03 ENCOUNTER — Ambulatory Visit (INDEPENDENT_AMBULATORY_CARE_PROVIDER_SITE_OTHER): Payer: BC Managed Care – PPO | Admitting: Gastroenterology

## 2013-08-03 ENCOUNTER — Encounter: Payer: Self-pay | Admitting: Gastroenterology

## 2013-08-03 VITALS — BP 138/80 | HR 70 | Ht 74.0 in | Wt 198.2 lb

## 2013-08-03 DIAGNOSIS — R1031 Right lower quadrant pain: Secondary | ICD-10-CM

## 2013-08-03 NOTE — Patient Instructions (Addendum)
You will be set up for a CT scan of abdomen and pelvis with IV and oral contrast for RLQ pain.  You have been scheduled for a CT scan of the abdomen and pelvis at [facility] ([address]---this is in the same building as Press [facility]).   You are scheduled on 08/05/13 at 9 am. You should arrive 15 minutes prior to your appointment time for registration. Please follow the written instructions below on the day of your exam:  WARNING: IF YOU ARE ALLERGIC TO IODINE/X-RAY DYE, PLEASE NOTIFY RADIOLOGY IMMEDIATELY AT [phone]! YOU WILL BE GIVEN A 13 HOUR PREMEDICATION PREP.  1) Do not eat or drink anything after 5 am  (4 hours prior to your test) 2) You have been given 2 bottles of oral contrast to drink. The solution may taste better if refrigerated, but do NOT add ice or any other liquid to this solution. Shake well before drinking.     Drink 1 bottle of contrast @ 7 am (2 hours prior to your exam)  Drink 1 bottle of contrast @ 8 am (1 hour prior to your exam)  You may take any medications as prescribed with a small amount of water except for the following: Metformin, Glucophage, Glucovance, Avandamet, Riomet, Fortamet, Actoplus Met, Janumet, Glumetza or Metaglip. The above medications must be held the day of the exam AND 48 hours after the exam.  The purpose of you drinking the oral contrast is to aid in the visualization of your intestinal tract. The contrast solution may cause some diarrhea. Before your exam is started, you will be given a small amount of fluid to drink. Depending on your individual set of symptoms, you may also receive an intravenous injection of x-ray contrast/dye. Plan on being at [facility] for 30 minutes or long, depending on the type of exam you are having performed.  This test typically takes 30-45 minutes to complete.  If you have any questions regarding your exam or if you need to reschedule, you may call the CT department at [phone]  between the hours of 8:00 am and 5:00 pm, Monday-Friday.  ________________________________________________________________________  AFter this result, will consider surgical referral for medium sized external hemorrhoid that is making hygiene of the are difficult.

## 2013-08-03 NOTE — Progress Notes (Signed)
Review of pertinent gastrointestinal problems: 1. Routine risk for colon cancer: colonsocopy Andrew Mckay 2008 was normal except mild diverticulitis, recommended recall colonoscopy at 10 years 2. RLQ pain (positional and at appy scar site): 2015 workup: labs cbc, cmet, esr, all normal  HPI: This is a   very pleasant 58 year old man whom I last saw 7 years ago the time of colonoscopy. He was here about 2 months ago seeing Andrew Mckay, nurse practitioner, about positional right lower corner discomfort.  Postional. Rated 2-4 at worst.  Change in bowels, generally more loose,  Some mucous and drainage.  No weight changes.  Seepage at anus.  Past Medical History  Diagnosis Date  . Diverticulosis of colon   . Tinnitus of both ears   . Skin lesions     Dr. Allyn Kenner removed, none biospied, face, torso    Past Surgical History  Procedure Laterality Date  . Colonoscopy  12-14-06    per Dr. Ardis Hughs, few diverticulae, no polyps, repeat in 10 yrs  . Appendectomy      age 89  . Tonsillectomy and adenoidectomy      age 16  . Mole removal      x several    Allergies as of 08/03/2013  . (No Known Allergies)    Family History  Problem Relation Age of Onset  . Adopted: Yes    History   Social History  . Marital Status: Married    Spouse Name: N/A    Number of Children: 0  . Years of Education: N/A   Occupational History  . [employer] Runner, broadcasting/film/video    Social History Main Topics  . Smoking status: Former Research scientist (life sciences)  . Smokeless tobacco: Never Used  . Alcohol Use: Yes     Comment: occ  . Drug Use: No  . Sexual Activity: Not on file   Other Topics Concern  . Not on file   Social History Narrative  . No narrative on file      Physical Exam: BP 138/80  Pulse 70  Ht $R'6\' 2"'ik$  (1.88 m)  Wt 198 lb 3.2 oz (89.903 kg)  BMI 25.44 kg/m2 Constitutional: generally well-appearing Psychiatric: alert and oriented x3 Abdomen: soft, nontender, nondistended, no obvious ascites, no  peritoneal signs, normal bowel sounds Rectal examination; medium-sized midline posterior external anal hemorrhoid was not thrombosed, no anal fissure. Stool is brown and Hemoccult negative    Assessment and plan: 58 y.o. male with right lower quadrant discomfort, external anal hemorrhoid that is likely causing fecal soilage  First his right lower quadrant pain starts exactly at the site of his very old appendectomy scar and radiates downward to his right groin. It is very positional. It only hurts if he is sitting down for more than about an hour or so. He is not tender on examination right now. I think it is highly unlikely that this is anything serious. It is probably adhesions related. He however has not had imaging tests to rule out significant lesions and I will arrange that for him. He does have a hemorrhoid, external that is medium in size and probably is contributing to his difficulty with fecal soilage intermittently. Pending the results of the CAT scan I will likely refer him to a general surgeon to consider hemorrhoid resection.

## 2013-08-05 ENCOUNTER — Ambulatory Visit (INDEPENDENT_AMBULATORY_CARE_PROVIDER_SITE_OTHER)
Admission: RE | Admit: 2013-08-05 | Discharge: 2013-08-05 | Disposition: A | Discharge status: Home / Self Care | Payer: BC Managed Care – PPO | Source: Ambulatory Visit | Attending: Gastroenterology | Admitting: Gastroenterology

## 2013-08-05 DIAGNOSIS — R1031 Right lower quadrant pain: Secondary | ICD-10-CM

## 2013-08-05 MED ORDER — IOHEXOL 300 MG/ML  SOLN
100.0000 mL | Freq: Once | INTRAMUSCULAR | Status: AC | PRN
  Administered 2013-08-05: 100 mL via INTRAVENOUS

## 2013-08-15 ENCOUNTER — Other Ambulatory Visit: Payer: Self-pay

## 2013-08-15 ENCOUNTER — Telehealth: Payer: Self-pay

## 2013-08-15 DIAGNOSIS — K649 Unspecified hemorrhoids: Secondary | ICD-10-CM

## 2013-08-15 NOTE — Telephone Encounter (Signed)
Message copied by Donata DuffLEWIS, Mekala Winger L on Mon Aug 15, 2013  3:35 PM ------      Message from: Zacarias PontesWYRICK, JANICE      Created: Mon Aug 15, 2013  3:10 PM       Pt is scheduled for consult on Aug 10 arrive at 2.40pm for a 3.00pm apt with Dr Maisie Fushomas.Liborio Nixon.Janice      ----- Message -----         From: Donata DuffPatty L Lewis, CMA         Sent: 08/15/2013  11:09 AM           To: Zacarias PontesJanice Wyrick            Needs appt to discuss external hemorrhoid resection       ------

## 2013-08-15 NOTE — Telephone Encounter (Signed)
CCS to notify pt  

## 2013-08-29 ENCOUNTER — Ambulatory Visit (INDEPENDENT_AMBULATORY_CARE_PROVIDER_SITE_OTHER): Payer: BC Managed Care – PPO | Admitting: General Surgery

## 2013-08-29 ENCOUNTER — Encounter (INDEPENDENT_AMBULATORY_CARE_PROVIDER_SITE_OTHER): Payer: Self-pay | Admitting: General Surgery

## 2013-08-29 VITALS — BP 132/80 | HR 60 | Temp 98.0°F | Resp 18 | Ht 74.0 in | Wt 198.0 lb

## 2013-08-29 DIAGNOSIS — IMO0001 Reserved for inherently not codable concepts without codable children: Secondary | ICD-10-CM

## 2013-08-29 DIAGNOSIS — K648 Other hemorrhoids: Secondary | ICD-10-CM

## 2013-08-29 MED ORDER — HYDROCORTISONE ACETATE 25 MG RE SUPP: 25.0000 mg | Freq: Every day | RECTAL | Status: DC | PRN

## 2013-08-29 NOTE — Progress Notes (Signed)
Chief Complaint  Patient presents with  . New Evaluation    hems    HISTORY: Andrew Mckay is a 58 y.o. male who presents to the office with rectal drainage.  Other symptoms include nothing.  This had been occurring for about a year.  he has tried nothing in the past.  Nothing makes the symptoms worse.   It is continuous in nature.  his bowel habits are regular and his bowel movements are usually soft.  his fiber intake is dietary.  his last colonoscopy was about 5 yrs ago and was normal.      Past Medical History  Diagnosis Date  . Diverticulosis of colon   . Tinnitus of both ears   . Skin lesions     Dr. Nita Sells removed, none biospied, face, torso      Past Surgical History  Procedure Laterality Date  . Colonoscopy  12-14-06    per Dr. Christella Hartigan, few diverticulae, no polyps, repeat in 10 yrs  . Appendectomy      age 68  . Tonsillectomy and adenoidectomy      age 72  . Mole removal      x several        Current Outpatient Prescriptions  Medication Sig Dispense Refill  . hydrocortisone (ANUSOL-HC) 25 MG suppository Place 1 suppository (25 mg total) rectally daily as needed for hemorrhoids or itching.  12 suppository  2   No current facility-administered medications for this visit.      No Known Allergies    Family History  Problem Relation Age of Onset  . Adopted: Yes    History   Social History  . Marital Status: Married    Spouse Name: N/A    Number of Children: 0  . Years of Education: N/A   Occupational History  . [employer] Management consultant    Social History Main Topics  . Smoking status: Former Games developer  . Smokeless tobacco: Never Used  . Alcohol Use: Yes     Comment: occ  . Drug Use: No  . Sexual Activity: None   Other Topics Concern  . None   Social History Narrative  . None      REVIEW OF SYSTEMS - PERTINENT POSITIVES ONLY: Review of Systems - General ROS: negative for - chills, fever or weight loss Hematological and Lymphatic  ROS: negative for - bleeding problems, blood clots or bruising Respiratory ROS: no cough, shortness of breath, or wheezing Cardiovascular ROS: no chest pain or dyspnea on exertion Gastrointestinal ROS: positive for - abdominal pain negative for - appetite loss or blood in stools Genito-Urinary ROS: no dysuria, trouble voiding, or hematuria  EXAM: Filed Vitals:   08/29/13 1510  BP: 132/80  Pulse: 60  Temp: 98 F (36.7 C)  Resp: 18    General appearance: alert and cooperative Resp: clear to auscultation bilaterally Cardio: regular rate and rhythm GI: soft, non-tender; bowel sounds normal; no masses,  no organomegaly  Procedure: Anoscopy Surgeon: Maisie Fus Diagnosis: anal drainage   Assistant: Hendricks After the risks and benefits were explained, verbal consent was obtained for above procedure  Anesthesia: none Findings: mild inflammation of the anal canal, grade 1 internal hemorrhoids, small, non-inflamed external skin tag    ASSESSMENT AND PLAN: Andrew Mckay is a 58 y.o. male Who presents to the office with a mucous-like anal drainage. He does have a small external hemorrhoid that appears to be flat and uninflamed. I do not think that this is  the cause of his drainage. He does have some mild grade 1 internal hemorrhoids that are inflamed on anoscopy. This may be the cause of his mucous drainage. We will try a hemorrhoid suppository to help with this. Patient will followup with me as needed.    Vanita PandaAlicia C Tommie Dejoseph, MD Colon and Rectal Surgery / General Surgery [facility]      Visit Diagnoses: 1. Prolapsed internal hemorrhoids, grade 1     Primary Care Physician: Judie PetitSWORDS,BRUCE HENRY, MD

## 2013-08-29 NOTE — Patient Instructions (Signed)

## 2013-12-08 DIAGNOSIS — H9319 Tinnitus, unspecified ear: Secondary | ICD-10-CM | POA: Insufficient documentation

## 2013-12-09 DIAGNOSIS — E559 Vitamin D deficiency, unspecified: Secondary | ICD-10-CM

## 2013-12-09 HISTORY — DX: Vitamin D deficiency, unspecified: E55.9

## 2014-08-13 IMAGING — CR DG HIP COMPLETE 2+V*R*
4 series · 4 of 4 positions shown · non-contrast
Comparison: None.

CLINICAL DATA: Right hip pain.  No known injury.

EXAM:
RIGHT HIP - COMPLETE 2+ VIEW

[view not recorded (1 of 4)]
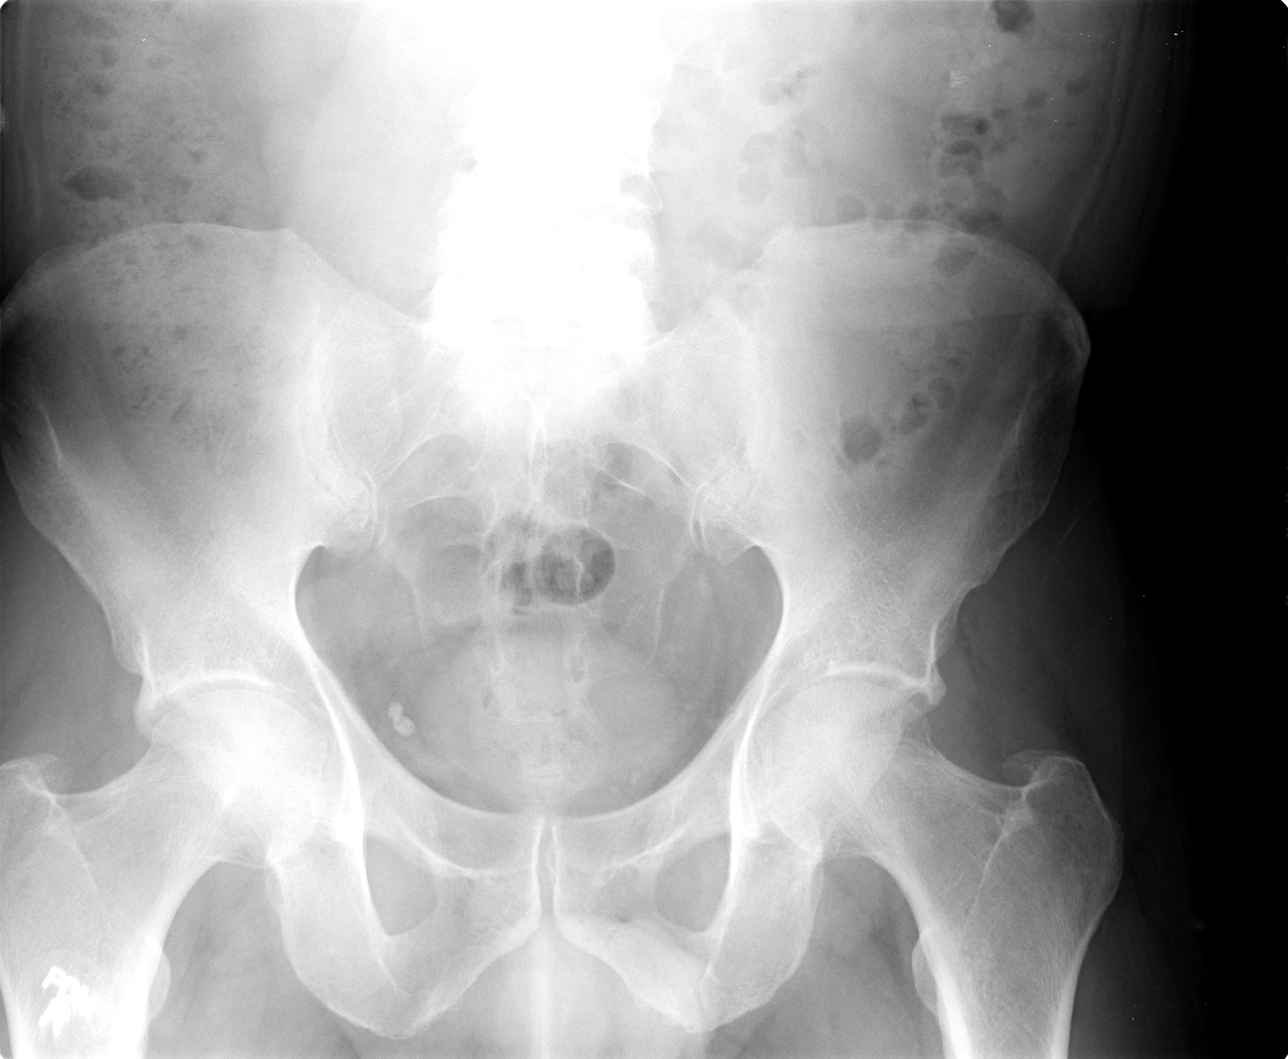

[view not recorded (2 of 4)]
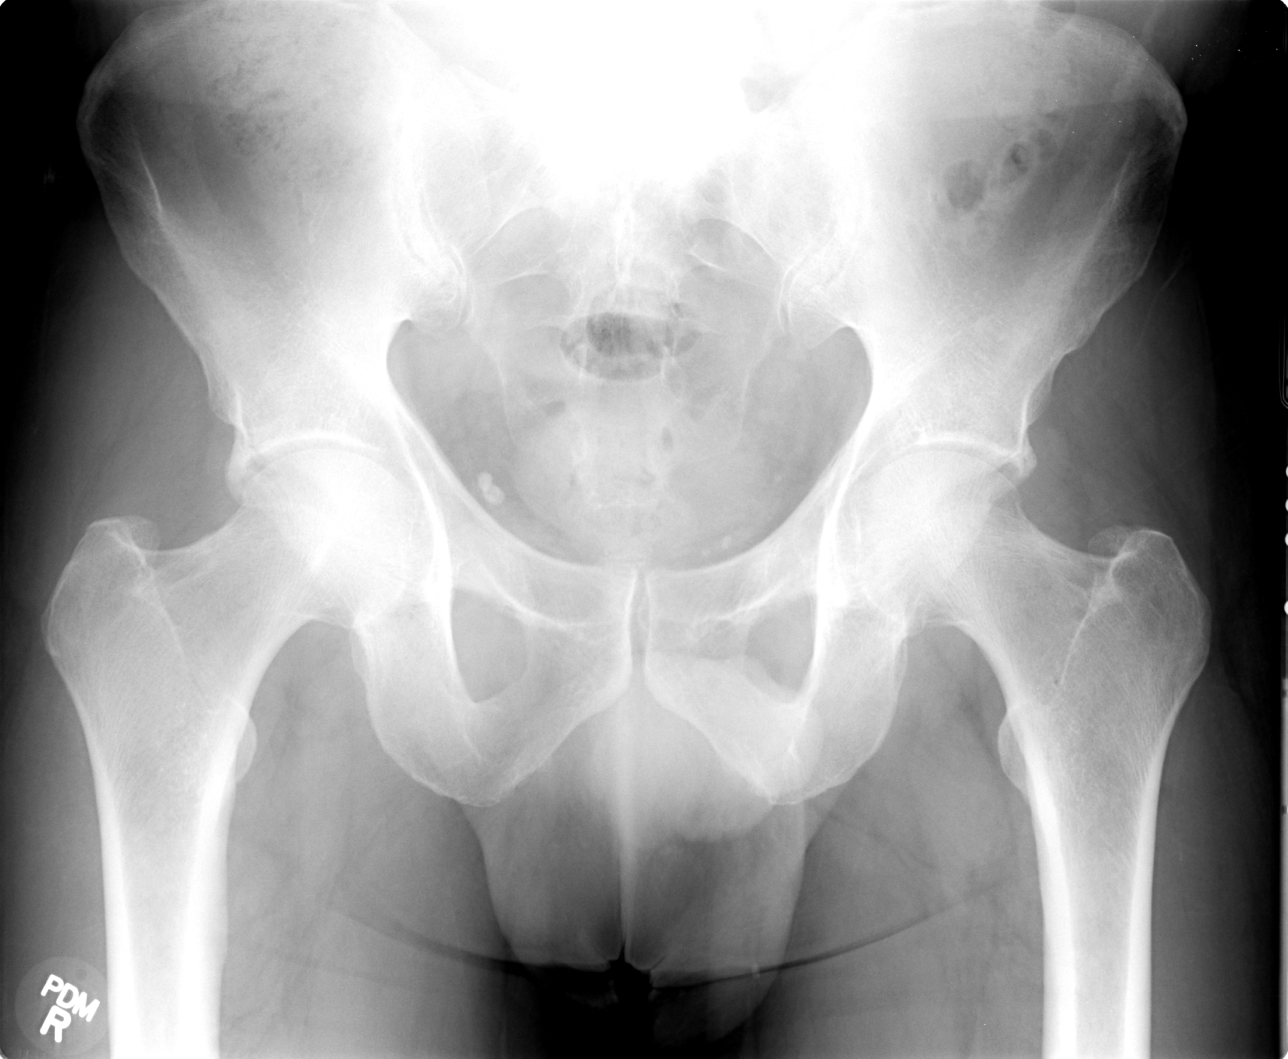

[view not recorded (3 of 4)]
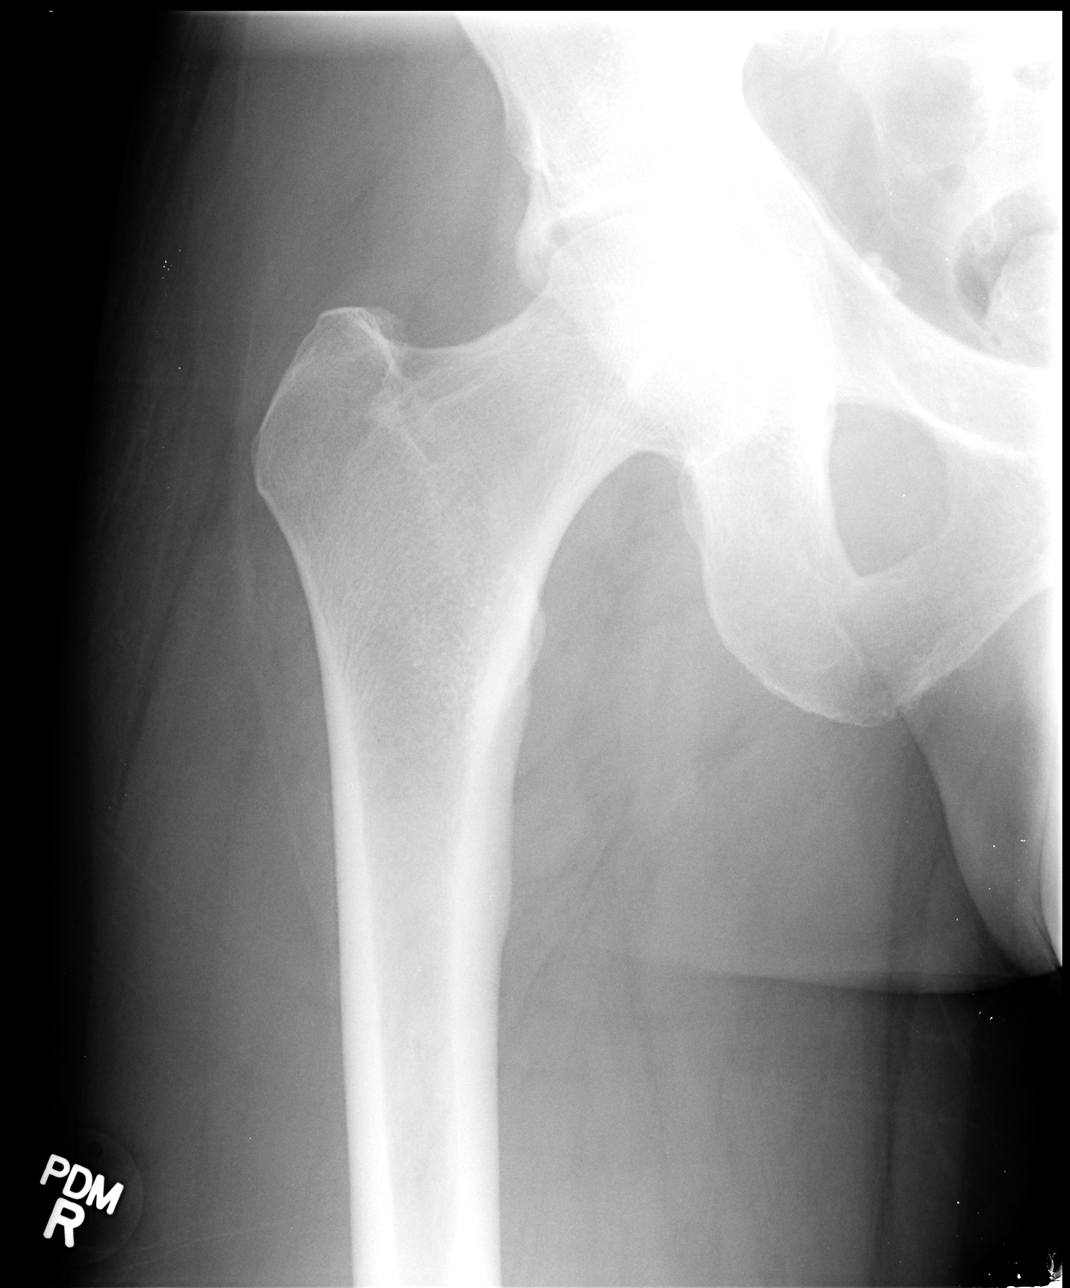

[view not recorded (4 of 4)]
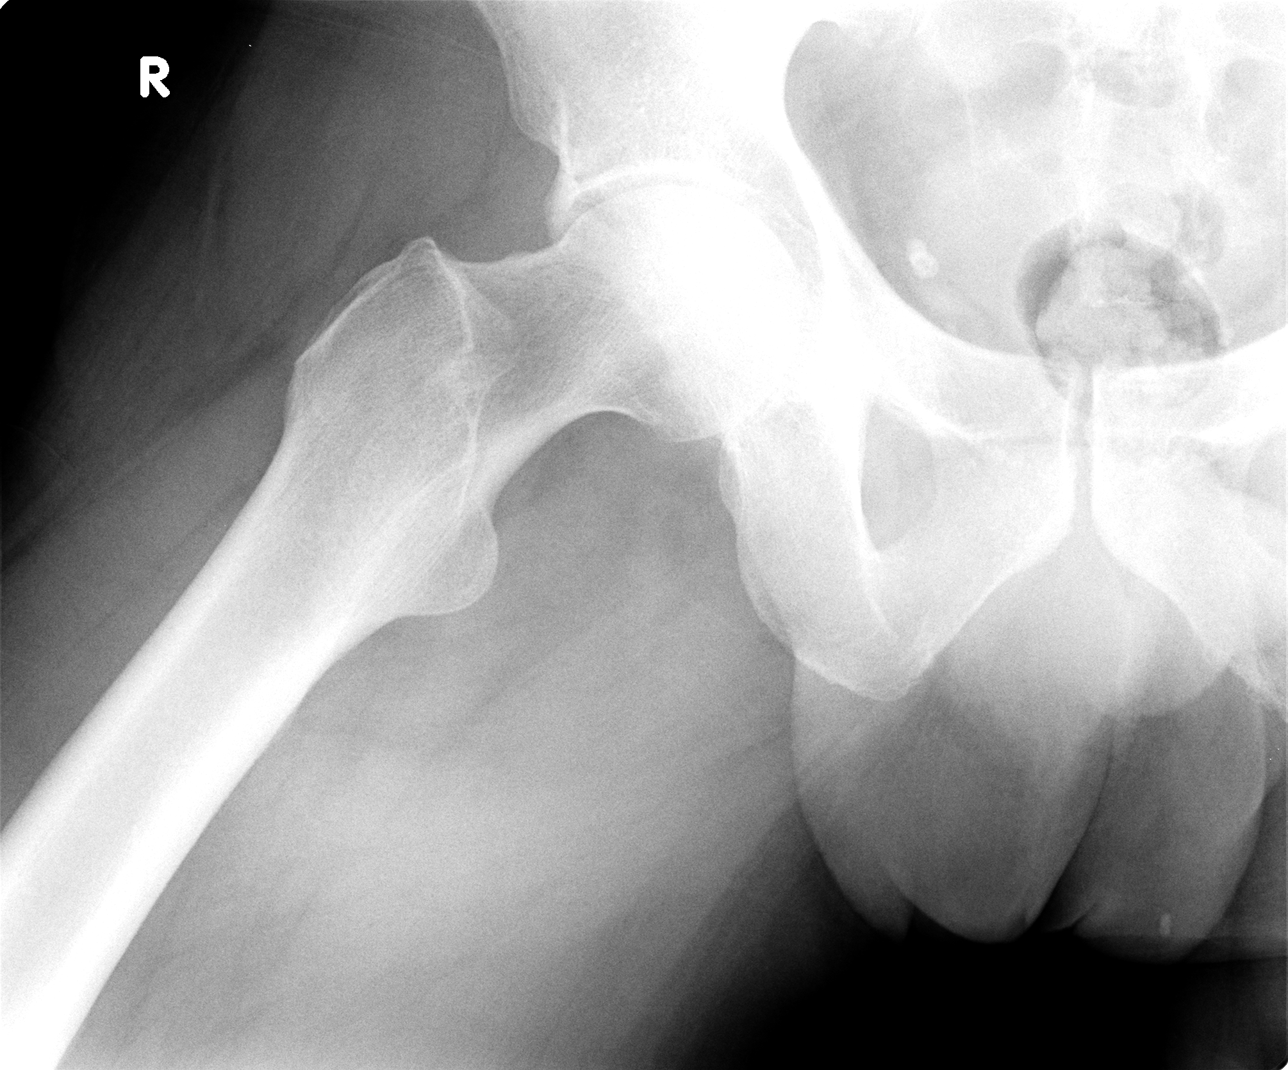

[4 of 4 positions shown; findings below may reference images not displayed]

FINDINGS: No fracture. No bone lesion. Mild axial hip joint space narrowing,
greater on the left. Bony prominence is noted along the superior
acetabula. No other arthropathic change. SI joints and symphysis
pubis are normally space and aligned. Soft tissues are unremarkable.
IMPRESSION: 1. No fracture or acute finding.
2. Minor right and mild left hip joint arthropathic change.

## 2016-01-26 DIAGNOSIS — Z23 Encounter for immunization: Secondary | ICD-10-CM | POA: Diagnosis not present

## 2016-02-06 DIAGNOSIS — R8299 Other abnormal findings in urine: Secondary | ICD-10-CM | POA: Diagnosis not present

## 2016-02-06 DIAGNOSIS — E559 Vitamin D deficiency, unspecified: Secondary | ICD-10-CM | POA: Diagnosis not present

## 2016-02-06 DIAGNOSIS — Z0000 Encounter for general adult medical examination without abnormal findings: Secondary | ICD-10-CM | POA: Diagnosis not present

## 2016-02-06 DIAGNOSIS — Z125 Encounter for screening for malignant neoplasm of prostate: Secondary | ICD-10-CM | POA: Diagnosis not present

## 2016-02-06 DIAGNOSIS — J09X2 Influenza due to identified novel influenza A virus with other respiratory manifestations: Secondary | ICD-10-CM | POA: Diagnosis not present

## 2016-02-13 DIAGNOSIS — J09X2 Influenza due to identified novel influenza A virus with other respiratory manifestations: Secondary | ICD-10-CM | POA: Diagnosis not present

## 2016-02-13 DIAGNOSIS — H9319 Tinnitus, unspecified ear: Secondary | ICD-10-CM | POA: Diagnosis not present

## 2016-02-13 DIAGNOSIS — E784 Other hyperlipidemia: Secondary | ICD-10-CM | POA: Diagnosis not present

## 2016-02-13 DIAGNOSIS — Z0000 Encounter for general adult medical examination without abnormal findings: Secondary | ICD-10-CM | POA: Diagnosis not present

## 2016-02-13 DIAGNOSIS — Z1389 Encounter for screening for other disorder: Secondary | ICD-10-CM | POA: Diagnosis not present

## 2016-02-13 DIAGNOSIS — R809 Proteinuria, unspecified: Secondary | ICD-10-CM | POA: Insufficient documentation

## 2016-02-13 DIAGNOSIS — Z23 Encounter for immunization: Secondary | ICD-10-CM | POA: Diagnosis not present

## 2016-02-13 DIAGNOSIS — R739 Hyperglycemia, unspecified: Secondary | ICD-10-CM | POA: Diagnosis not present

## 2016-02-13 DIAGNOSIS — E559 Vitamin D deficiency, unspecified: Secondary | ICD-10-CM | POA: Diagnosis not present

## 2016-02-14 DIAGNOSIS — R809 Proteinuria, unspecified: Secondary | ICD-10-CM | POA: Diagnosis not present

## 2016-03-12 DIAGNOSIS — L821 Other seborrheic keratosis: Secondary | ICD-10-CM | POA: Diagnosis not present

## 2016-03-12 DIAGNOSIS — D2262 Melanocytic nevi of left upper limb, including shoulder: Secondary | ICD-10-CM | POA: Diagnosis not present

## 2016-03-12 DIAGNOSIS — D2261 Melanocytic nevi of right upper limb, including shoulder: Secondary | ICD-10-CM | POA: Diagnosis not present

## 2016-03-12 DIAGNOSIS — L918 Other hypertrophic disorders of the skin: Secondary | ICD-10-CM | POA: Diagnosis not present

## 2016-10-18 DIAGNOSIS — Z23 Encounter for immunization: Secondary | ICD-10-CM | POA: Diagnosis not present

## 2016-12-08 ENCOUNTER — Encounter: Payer: Self-pay | Admitting: Gastroenterology

## 2017-03-10 DIAGNOSIS — E7849 Other hyperlipidemia: Secondary | ICD-10-CM | POA: Diagnosis not present

## 2017-03-10 DIAGNOSIS — Z125 Encounter for screening for malignant neoplasm of prostate: Secondary | ICD-10-CM | POA: Diagnosis not present

## 2017-03-10 DIAGNOSIS — E559 Vitamin D deficiency, unspecified: Secondary | ICD-10-CM | POA: Diagnosis not present

## 2017-03-10 DIAGNOSIS — R82998 Other abnormal findings in urine: Secondary | ICD-10-CM | POA: Diagnosis not present

## 2017-03-10 DIAGNOSIS — R7309 Other abnormal glucose: Secondary | ICD-10-CM | POA: Diagnosis not present

## 2017-03-10 DIAGNOSIS — Z0000 Encounter for general adult medical examination without abnormal findings: Secondary | ICD-10-CM | POA: Diagnosis not present

## 2017-03-17 DIAGNOSIS — E559 Vitamin D deficiency, unspecified: Secondary | ICD-10-CM | POA: Diagnosis not present

## 2017-03-17 DIAGNOSIS — R808 Other proteinuria: Secondary | ICD-10-CM | POA: Diagnosis not present

## 2017-03-17 DIAGNOSIS — Z0000 Encounter for general adult medical examination without abnormal findings: Secondary | ICD-10-CM | POA: Diagnosis not present

## 2017-03-17 DIAGNOSIS — Z1389 Encounter for screening for other disorder: Secondary | ICD-10-CM | POA: Diagnosis not present

## 2017-03-17 DIAGNOSIS — E7849 Other hyperlipidemia: Secondary | ICD-10-CM | POA: Diagnosis not present

## 2017-03-17 DIAGNOSIS — R7309 Other abnormal glucose: Secondary | ICD-10-CM | POA: Diagnosis not present

## 2017-03-19 DIAGNOSIS — Z1212 Encounter for screening for malignant neoplasm of rectum: Secondary | ICD-10-CM | POA: Diagnosis not present

## 2017-04-23 DIAGNOSIS — L738 Other specified follicular disorders: Secondary | ICD-10-CM | POA: Diagnosis not present

## 2017-04-23 DIAGNOSIS — D2261 Melanocytic nevi of right upper limb, including shoulder: Secondary | ICD-10-CM | POA: Diagnosis not present

## 2017-04-23 DIAGNOSIS — L821 Other seborrheic keratosis: Secondary | ICD-10-CM | POA: Diagnosis not present

## 2017-04-23 DIAGNOSIS — D2262 Melanocytic nevi of left upper limb, including shoulder: Secondary | ICD-10-CM | POA: Diagnosis not present

## 2017-10-17 DIAGNOSIS — Z23 Encounter for immunization: Secondary | ICD-10-CM | POA: Diagnosis not present

## 2018-04-08 DIAGNOSIS — J069 Acute upper respiratory infection, unspecified: Secondary | ICD-10-CM | POA: Diagnosis not present

## 2018-04-26 DIAGNOSIS — N390 Urinary tract infection, site not specified: Secondary | ICD-10-CM | POA: Diagnosis not present

## 2018-04-26 DIAGNOSIS — Z125 Encounter for screening for malignant neoplasm of prostate: Secondary | ICD-10-CM | POA: Diagnosis not present

## 2018-04-26 DIAGNOSIS — E785 Hyperlipidemia, unspecified: Secondary | ICD-10-CM | POA: Diagnosis not present

## 2018-04-26 DIAGNOSIS — R739 Hyperglycemia, unspecified: Secondary | ICD-10-CM | POA: Diagnosis not present

## 2018-04-26 DIAGNOSIS — E7849 Other hyperlipidemia: Secondary | ICD-10-CM | POA: Diagnosis not present

## 2018-04-26 DIAGNOSIS — Z0000 Encounter for general adult medical examination without abnormal findings: Secondary | ICD-10-CM | POA: Diagnosis not present

## 2018-04-26 DIAGNOSIS — E559 Vitamin D deficiency, unspecified: Secondary | ICD-10-CM | POA: Diagnosis not present

## 2018-05-03 DIAGNOSIS — E559 Vitamin D deficiency, unspecified: Secondary | ICD-10-CM | POA: Diagnosis not present

## 2018-05-03 DIAGNOSIS — E785 Hyperlipidemia, unspecified: Secondary | ICD-10-CM | POA: Diagnosis not present

## 2018-05-03 DIAGNOSIS — H9319 Tinnitus, unspecified ear: Secondary | ICD-10-CM | POA: Diagnosis not present

## 2018-05-03 DIAGNOSIS — R030 Elevated blood-pressure reading, without diagnosis of hypertension: Secondary | ICD-10-CM | POA: Insufficient documentation

## 2018-05-03 DIAGNOSIS — Z1331 Encounter for screening for depression: Secondary | ICD-10-CM | POA: Diagnosis not present

## 2018-05-03 DIAGNOSIS — Z0000 Encounter for general adult medical examination without abnormal findings: Secondary | ICD-10-CM | POA: Diagnosis not present

## 2018-05-03 DIAGNOSIS — R739 Hyperglycemia, unspecified: Secondary | ICD-10-CM | POA: Diagnosis not present

## 2018-05-06 ENCOUNTER — Other Ambulatory Visit: Payer: Self-pay | Admitting: Internal Medicine

## 2018-05-06 DIAGNOSIS — E785 Hyperlipidemia, unspecified: Secondary | ICD-10-CM

## 2018-06-16 DIAGNOSIS — L821 Other seborrheic keratosis: Secondary | ICD-10-CM | POA: Diagnosis not present

## 2018-06-16 DIAGNOSIS — D2262 Melanocytic nevi of left upper limb, including shoulder: Secondary | ICD-10-CM | POA: Diagnosis not present

## 2018-06-16 DIAGNOSIS — D2261 Melanocytic nevi of right upper limb, including shoulder: Secondary | ICD-10-CM | POA: Diagnosis not present

## 2018-06-16 DIAGNOSIS — L570 Actinic keratosis: Secondary | ICD-10-CM | POA: Diagnosis not present

## 2018-06-16 DIAGNOSIS — L820 Inflamed seborrheic keratosis: Secondary | ICD-10-CM | POA: Diagnosis not present

## 2018-06-23 DIAGNOSIS — H43813 Vitreous degeneration, bilateral: Secondary | ICD-10-CM | POA: Diagnosis not present

## 2018-06-23 DIAGNOSIS — H52203 Unspecified astigmatism, bilateral: Secondary | ICD-10-CM | POA: Diagnosis not present

## 2018-06-23 DIAGNOSIS — H5213 Myopia, bilateral: Secondary | ICD-10-CM | POA: Diagnosis not present

## 2018-10-21 DIAGNOSIS — Z23 Encounter for immunization: Secondary | ICD-10-CM | POA: Diagnosis not present

## 2019-02-13 DIAGNOSIS — Z20828 Contact with and (suspected) exposure to other viral communicable diseases: Secondary | ICD-10-CM | POA: Diagnosis not present

## 2019-04-28 DIAGNOSIS — Z23 Encounter for immunization: Secondary | ICD-10-CM | POA: Diagnosis not present

## 2019-05-03 DIAGNOSIS — E7849 Other hyperlipidemia: Secondary | ICD-10-CM | POA: Diagnosis not present

## 2019-05-03 DIAGNOSIS — Z125 Encounter for screening for malignant neoplasm of prostate: Secondary | ICD-10-CM | POA: Diagnosis not present

## 2019-05-03 DIAGNOSIS — Z0000 Encounter for general adult medical examination without abnormal findings: Secondary | ICD-10-CM | POA: Diagnosis not present

## 2019-05-03 DIAGNOSIS — R739 Hyperglycemia, unspecified: Secondary | ICD-10-CM | POA: Diagnosis not present

## 2019-05-03 DIAGNOSIS — E559 Vitamin D deficiency, unspecified: Secondary | ICD-10-CM | POA: Diagnosis not present

## 2019-05-04 DIAGNOSIS — Z1152 Encounter for screening for COVID-19: Secondary | ICD-10-CM | POA: Diagnosis not present

## 2019-05-09 DIAGNOSIS — R739 Hyperglycemia, unspecified: Secondary | ICD-10-CM | POA: Diagnosis not present

## 2019-05-09 DIAGNOSIS — R809 Proteinuria, unspecified: Secondary | ICD-10-CM | POA: Diagnosis not present

## 2019-05-09 DIAGNOSIS — R030 Elevated blood-pressure reading, without diagnosis of hypertension: Secondary | ICD-10-CM | POA: Diagnosis not present

## 2019-05-09 DIAGNOSIS — Z1331 Encounter for screening for depression: Secondary | ICD-10-CM | POA: Diagnosis not present

## 2019-05-09 DIAGNOSIS — R82998 Other abnormal findings in urine: Secondary | ICD-10-CM | POA: Diagnosis not present

## 2019-05-09 DIAGNOSIS — E785 Hyperlipidemia, unspecified: Secondary | ICD-10-CM | POA: Diagnosis not present

## 2019-05-09 DIAGNOSIS — Z0000 Encounter for general adult medical examination without abnormal findings: Secondary | ICD-10-CM | POA: Diagnosis not present

## 2019-05-11 DIAGNOSIS — Z1212 Encounter for screening for malignant neoplasm of rectum: Secondary | ICD-10-CM | POA: Diagnosis not present

## 2019-05-26 DIAGNOSIS — Z23 Encounter for immunization: Secondary | ICD-10-CM | POA: Diagnosis not present

## 2019-05-31 DIAGNOSIS — R030 Elevated blood-pressure reading, without diagnosis of hypertension: Secondary | ICD-10-CM | POA: Diagnosis not present

## 2019-06-08 DIAGNOSIS — R030 Elevated blood-pressure reading, without diagnosis of hypertension: Secondary | ICD-10-CM | POA: Diagnosis not present

## 2019-08-09 DIAGNOSIS — D2371 Other benign neoplasm of skin of right lower limb, including hip: Secondary | ICD-10-CM | POA: Diagnosis not present

## 2019-08-09 DIAGNOSIS — D2262 Melanocytic nevi of left upper limb, including shoulder: Secondary | ICD-10-CM | POA: Diagnosis not present

## 2019-08-09 DIAGNOSIS — D225 Melanocytic nevi of trunk: Secondary | ICD-10-CM | POA: Diagnosis not present

## 2019-08-09 DIAGNOSIS — D2271 Melanocytic nevi of right lower limb, including hip: Secondary | ICD-10-CM | POA: Diagnosis not present

## 2019-10-22 DIAGNOSIS — Z23 Encounter for immunization: Secondary | ICD-10-CM | POA: Diagnosis not present

## 2020-03-26 DIAGNOSIS — Z1211 Encounter for screening for malignant neoplasm of colon: Secondary | ICD-10-CM | POA: Diagnosis not present

## 2020-03-26 DIAGNOSIS — Z1212 Encounter for screening for malignant neoplasm of rectum: Secondary | ICD-10-CM | POA: Diagnosis not present

## 2020-04-03 LAB — COLOGUARD: COLOGUARD: NEGATIVE

## 2020-04-03 LAB — EXTERNAL GENERIC LAB PROCEDURE: COLOGUARD: NEGATIVE

## 2020-05-10 DIAGNOSIS — E785 Hyperlipidemia, unspecified: Secondary | ICD-10-CM | POA: Diagnosis not present

## 2020-05-10 DIAGNOSIS — E559 Vitamin D deficiency, unspecified: Secondary | ICD-10-CM | POA: Diagnosis not present

## 2020-05-10 DIAGNOSIS — R739 Hyperglycemia, unspecified: Secondary | ICD-10-CM | POA: Diagnosis not present

## 2020-05-10 DIAGNOSIS — Z125 Encounter for screening for malignant neoplasm of prostate: Secondary | ICD-10-CM | POA: Diagnosis not present

## 2020-05-16 ENCOUNTER — Other Ambulatory Visit: Payer: Self-pay | Admitting: Internal Medicine

## 2020-05-16 DIAGNOSIS — E785 Hyperlipidemia, unspecified: Secondary | ICD-10-CM | POA: Diagnosis not present

## 2020-05-16 DIAGNOSIS — Z23 Encounter for immunization: Secondary | ICD-10-CM | POA: Diagnosis not present

## 2020-05-16 DIAGNOSIS — Z0000 Encounter for general adult medical examination without abnormal findings: Secondary | ICD-10-CM | POA: Diagnosis not present

## 2020-05-16 DIAGNOSIS — Z1331 Encounter for screening for depression: Secondary | ICD-10-CM | POA: Diagnosis not present

## 2020-06-22 DIAGNOSIS — R972 Elevated prostate specific antigen [PSA]: Secondary | ICD-10-CM | POA: Diagnosis not present

## 2020-06-22 DIAGNOSIS — R350 Frequency of micturition: Secondary | ICD-10-CM | POA: Diagnosis not present

## 2020-06-22 DIAGNOSIS — N401 Enlarged prostate with lower urinary tract symptoms: Secondary | ICD-10-CM | POA: Diagnosis not present

## 2020-06-22 DIAGNOSIS — R3912 Poor urinary stream: Secondary | ICD-10-CM | POA: Diagnosis not present

## 2020-08-03 ENCOUNTER — Ambulatory Visit: Payer: Self-pay | Admitting: Nurse Practitioner

## 2020-08-28 ENCOUNTER — Other Ambulatory Visit: Payer: Self-pay

## 2020-08-28 DIAGNOSIS — R195 Other fecal abnormalities: Secondary | ICD-10-CM | POA: Insufficient documentation

## 2020-08-29 ENCOUNTER — Encounter: Payer: Self-pay | Admitting: Gastroenterology

## 2020-08-29 ENCOUNTER — Ambulatory Visit (INDEPENDENT_AMBULATORY_CARE_PROVIDER_SITE_OTHER): Payer: BC Managed Care – PPO | Admitting: Nurse Practitioner

## 2020-08-29 ENCOUNTER — Encounter: Payer: Self-pay | Admitting: Nurse Practitioner

## 2020-08-29 VITALS — BP 126/74 | Ht 74.0 in | Wt 195.8 lb

## 2020-08-29 DIAGNOSIS — K5730 Diverticulosis of large intestine without perforation or abscess without bleeding: Secondary | ICD-10-CM

## 2020-08-29 DIAGNOSIS — R195 Other fecal abnormalities: Secondary | ICD-10-CM

## 2020-08-29 DIAGNOSIS — R1032 Left lower quadrant pain: Secondary | ICD-10-CM | POA: Diagnosis not present

## 2020-08-29 NOTE — Patient Instructions (Addendum)
If you are age 65 or younger, your body mass index should be between 19-25. Your Body mass index is 25.14 kg/m. If this is out of the aformentioned range listed, please consider follow up with your Primary Care Provider.   PROCEDURES: You have been scheduled for a colonoscopy. Please follow the written instructions given to you at your visit today. If you use inhalers (even only as needed), please bring them with you on the day of your procedure.  Please call our office if your symptoms worsen.  It was great seeing you today! Thank you for entrusting me with your care and choosing [facility].  Arnaldo Natal, CRNP  The [facility] providers would like to encourage you to use [facility] to communicate with providers for non-urgent requests or questions.  Due to long hold times on the telephone, sending your provider a message by [facility] may be faster and more efficient way to get a response. Please allow 48 business hours for a response.  Please remember that this is for non-urgent requests/questions.

## 2020-08-29 NOTE — Progress Notes (Signed)
08/29/2020 Andrew Mckay [id number] 1955-02-23   CHIEF COMPLAINT: + hemosure test, recurrent LLQ pain   HISTORY OF PRESENT ILLNESS:  Andrew Mckay. Leach is a 65 year old male with a past medical history of hypertension and diverticulosis.  He presents to our office today as referred by Dr. Haynes Kerns for further evaluation regarding a positive Hemosure test completed 05/2020.   He described having intermittent episodes of presumed diverticulitis over the past 3 to 4 years with more frequent episodes over the past year.  He reported having 5 episodes of left lower quadrant abdominal pain sometimes associated with a fever.  He first develops central lower abdominal pain which localizes to the left lower quadrant and last for 3 to 4 days.  Several of these episodes he stayed in bed for several days and admitted the pain was quite severe.  He denied seeing his PCP or going to the ED for any of these episodes of presumed diverticulitis.  He denies taking any antibiotics for these episodes as well.  No specific food or stress triggers.  He denies having any constipation issues.  He has infrequent spotting of red blood per the rectum which she attributes to having hemorrhoids.  No melena.  He underwent a screening colonoscopy by Dr. Ardis Hughs 12/14/2006 which showed a few small diverticulum in the descending and sigmoid colon, no polyps were identified.  At that time, he was advised to repeat a colonoscopy in 10 years.  A Cologuard test 03/26/2020 was negative.  He is adopted therefore family history is unknown.  He was last seen in the office by Dr. Ardis Hughs 08/04/2018 2015 due to RLQ/groin pain which was thought to be associated with his past appendectomy scar site.  CBC was normal. CTAP showed minimal inguinal hernias, no evidence of appendicitis or diverticulitis.  Laboratory studies 05/16/2020: WBC 4.89.  Hemoglobin 15.9.  Hematocrit 44.3.  Platelet 160.  Glucose 100.  BUN 16.  Creatinine 1.1.  Sodium 139.  Potassium  4.7.  Calcium 9.2.  Albumin 3.7.  AST 15.  ALT 20.  Alk phos 85.  Total bili 1.0.  Colonoscopy 12/14/2006: Few small diverticulum descending to sigmoid colon No polyps  Cologuard 03/26/2020: Negative  CTAP 08/05/2013 and due to right groin pain. 1. No explanation for right groin pain.  2. Minimal inguinal hernias.  No ventral hernia.  3. Appendix is not identified, but no evidence appendicitis.  4. Sigmoid colon diverticulosis without diverticulitis.  Past Medical History:  Diagnosis Date   Diverticulosis of colon    Skin lesions    Dr. Allyn Kenner removed, none biospied, face, torso   Tinnitus of both ears    Vitamin D deficiency, unspecified 12/09/2013   Past Surgical History:  Procedure Laterality Date   APPENDECTOMY     age 89   COLONOSCOPY  12-14-06   per Dr. Ardis Hughs, few diverticulae, no polyps, repeat in 10 yrs   MOLE REMOVAL     x several   TONSILLECTOMY AND ADENOIDECTOMY     age 87  He denies any problems with sedation/anesthesia or airway management/intubation during any of his past procedures or surgeries.  Social History: He is married.  He is a English as a second language teacher.  He smoked 1ppd, quit smoking 20 -25 years ago. He drinks one glass of wine once monthly or less.  No drug use  Family History: He is adopted therefore family history is unknown  No Known Allergies   Outpatient Encounter Medications as of 08/29/2020  Medication Sig   Cholecalciferol (QC VITAMIN D3) 50 MCG (2000 UT) TABS Take 1 tablet by mouth daily.   hydrocortisone (ANUSOL-HC) 25 MG suppository Place 1 suppository (25 mg total) rectally daily as needed for hemorrhoids or itching.   irbesartan (AVAPRO) 75 MG tablet Take 1 tablet by mouth at bedtime.   Multiple Vitamins-Minerals (DAILY MULTIVITAMIN) CAPS Take 1 capsule by mouth daily.   No facility-administered encounter medications on file as of 08/29/2020.   REVIEW OF SYSTEMS:  Gen: Denies fever, sweats or chills. No weight loss.  CV: Denies  chest pain, palpitations or edema. Resp: Denies cough, shortness of breath of hemoptysis.  GI: See HPI. GU : Denies urinary burning, blood in urine, increased urinary frequency or incontinence. MS: Denies joint pain, muscles aches or weakness. Derm: Denies rash, itchiness, skin lesions or unhealing ulcers. Psych: Denies depression, anxiety or memory loss. Heme: Denies bruising, bleeding. Neuro:  Denies headaches, dizziness or paresthesias. Endo:  Denies any problems with DM, thyroid or adrenal function.  PHYSICAL EXAM: BP 126/74   Ht _0  (1.88 m)   Wt 195 lb 12.8 oz (88.8 kg)   BMI 25.14 kg/m   General: 65 year old male in no acute distress. Head: Normocephalic and atraumatic. Eyes:  Sclerae non-icteric, conjunctive pink. Ears: Normal auditory acuity. Mouth: Dentition intact. No ulcers or lesions.  Neck: Supple, no lymphadenopathy or thyromegaly.  Lungs: Clear bilaterally to auscultation without wheezes, crackles or rhonchi. Heart: Regular rate and rhythm.  Loud S2.  No murmur, rub or gallop appreciated.  Abdomen: Soft, nontender, non distended. No masses. No hepatosplenomegaly. Normoactive bowel sounds x 4 quadrants.  Rectal: Deferred. Musculoskeletal: Symmetrical with no gross deformities. Skin: Warm and dry. No rash or lesions on visible extremities. Extremities: No edema. Neurological: Alert oriented x 4, no focal deficits.  Psychological:  Alert and cooperative. Normal mood and affect.  ASSESSMENT AND PLAN:  65 year old male with a history of diverticulosis with  recurrent LLQ pain +/- fever, likely had left sided diverticulitis which resolved without antibiotic treatment. + Hemosure test. Negative Cologaurd test. Colonoscopy in 2008 showed diverticula to the descending and sigmoid colon, no polyps. -Colonoscopy to assess for diverticular disease, rule out colorectal malignancy. Colonoscopy benefits and risks discussed including risk with sedation, risk of bleeding,  perforation and infection  -Patient advised to call the office if his LLQ pain recurs prior to his colonoscopy procedure date -Recommend CTAP, CBC and CMP if his LLQ pain recurs -Avoid constipation -Further recommendations to be determined after the above evaluation completed      CC:  Swords, Darrick Penna, MD

## 2020-08-29 NOTE — Progress Notes (Signed)
I agree with the above note, plan 

## 2020-09-03 ENCOUNTER — Telehealth: Payer: Self-pay | Admitting: Gastroenterology

## 2020-09-03 NOTE — Telephone Encounter (Signed)
The pt last saw [facility] please route accordingly.

## 2020-09-03 NOTE — Telephone Encounter (Signed)
Spoke with patient. Patient states starting Friday he began having a "flare up." Patients states he was advised by Jill Side  to inform office if this were to happen to determine if patient is to procedure. Please advise.

## 2020-09-04 ENCOUNTER — Encounter: Payer: BC Managed Care – PPO | Admitting: Gastroenterology

## 2020-09-06 NOTE — Telephone Encounter (Signed)
Spoke with patient. Patient states he feels "flare" is going down,  but would really like to have appt with [facility]. Appt has been scheduled patient is aware, nothing further needed at this time.

## 2020-09-11 ENCOUNTER — Telehealth: Payer: Self-pay | Admitting: Nurse Practitioner

## 2020-09-11 NOTE — Telephone Encounter (Signed)
See phone note, patient to keep his office appt 09/20/2020 as scheduled.

## 2020-09-11 NOTE — Telephone Encounter (Signed)
Called and spoke with patient. Patient was scheduled for colon pm 8/16 but was unable to follow through due to having a diverticulitis flare. Patient is scheduled for follow up OV 9/1 and is wondering does he to keep follow up visit or be rescheduled for colon., Please advise, thank you.

## 2020-09-11 NOTE — Telephone Encounter (Signed)
Spoke with patient, patient made aware to keep appointment already scheduled. Patient verbalized understanding, nothing further needed at this time.

## 2020-09-20 ENCOUNTER — Encounter: Payer: Self-pay | Admitting: Nurse Practitioner

## 2020-09-20 ENCOUNTER — Ambulatory Visit (INDEPENDENT_AMBULATORY_CARE_PROVIDER_SITE_OTHER): Payer: BC Managed Care – PPO | Admitting: Nurse Practitioner

## 2020-09-20 VITALS — BP 140/82 | HR 59 | Ht 74.0 in | Wt 194.5 lb

## 2020-09-20 DIAGNOSIS — K5730 Diverticulosis of large intestine without perforation or abscess without bleeding: Secondary | ICD-10-CM | POA: Diagnosis not present

## 2020-09-20 DIAGNOSIS — R1032 Left lower quadrant pain: Secondary | ICD-10-CM | POA: Diagnosis not present

## 2020-09-20 NOTE — Progress Notes (Signed)
09/20/2020 COCHISE DINNEEN [id number] [birth year]   Chief Complaint: Follow up diverticulitis  History of Present Illness:  Andrew Mckay. Andrew Mckay is a 65 year old male with a past medical history of hypertension and diverticulosis.  I saw the patient in office on 08/29/2020 due to having presumed episodes of recurrent diverticulitis over the past 3 to 4 years and he had a recent positive hemosure test. Refer to 8/10 office consult note for comprehensive history review. He was scheduled for a colonoscopy with Dr. Christella Hartigan on 8/16/202, however, the patient developed recurrent LLQ pain 08/31/2020 and he called our office on 09/03/2020 and his colonoscopy was canceled. He stated he tries to avoid antibiotics as his "diverticulitis" flares resolve without treatment. He stated his LLQ pain was gone by Tues 09/04/2020 without recurrence since then. He wishes to reschedule a colonoscopy at this time, preferably prior to 10/06/2020 before his health insurance changes to Medicare. He denies having any constipation. No abdominal pain at this time.   He underwent a screening colonoscopy by Dr. Christella Hartigan 12/14/2006 which showed a few small diverticulum in the descending and sigmoid colon, no polyps were identified.  At that time, he was advised to repeat a colonoscopy in 10 years which was not done.  A Cologuard test 03/26/2020 was negative.  He is adopted therefore family history is unknown.  Past Medical History:  Diagnosis Date   Diverticulosis of colon    Hypertension    Skin lesions    Dr. Nita Sells removed, none biospied, face, torso   Tinnitus of both ears    Vitamin D deficiency, unspecified 12/09/2013   Past Surgical History:  Procedure Laterality Date   APPENDECTOMY     age 1   COLONOSCOPY  12-14-06   per Dr. Christella Hartigan, few diverticulae, no polyps, repeat in 10 yrs   MOLE REMOVAL     x several   TONSILLECTOMY AND ADENOIDECTOMY     age 43    Current Medications, Allergies, Past Medical History, Past  Surgical History, Family History and Social History were reviewed in [employer] electronic medical record.   Review of Systems:   Constitutional: Negative for fever, sweats, chills or weight loss.  Respiratory: Negative for shortness of breath.   Cardiovascular: Negative for chest pain, palpitations and leg swelling.  Gastrointestinal: See HPI.  Musculoskeletal: Negative for back pain or muscle aches.  Neurological: Negative for dizziness, headaches or paresthesias.    Physical Exam: BP 140/82   Pulse (!) 59   Ht 6\' 2"  (1.88 m)   Wt 194 lb 8 oz (88.2 kg)   BMI 24.97 kg/m  General: 65 year old male in no acute distress. Head: Normocephalic and atraumatic. Eyes: No scleral icterus. Conjunctiva pink . Ears: Normal auditory acuity. Mouth: Dentition intact. No ulcers or lesions.  Lungs: Clear throughout to auscultation. Heart: Regular rate and rhythm, no murmur. Abdomen: Soft, nontender and nondistended. No masses or hepatomegaly. Normal bowel sounds x 4 quadrants.  Rectal: Deferred.  Musculoskeletal: Symmetrical with no gross deformities. Extremities: No edema. Neurological: Alert oriented x 4. No focal deficits.  Psychological: Alert and cooperative. Normal mood and affect  Assessment and Recommendations:  7) 65 year old male with recurrent LLQ pain, presumed diverticulitis. Last episode of LLQ pain occurred 08/31/2020 which resolved without antibiotic tx. His 09/04/2020 colonoscopy was canceled.  -Reschedule colonoscopy with Dr. 09/06/2020, refer to 08/29/2020 office visit for colonoscopy orders and consent -Patient aware to call our office if his LLQ pain recurs,  if he develops LLQ pain he will require a CBC, CMP and a CTAP prior to pursuing a colonoscopy -Further recommendations to be determined after colonoscopy completed

## 2020-09-20 NOTE — Patient Instructions (Addendum)
If you are age 65 or younger, your body mass index should be between 19-25. Your Body mass index is 24.97 kg/m. If this is out of the aformentioned range listed, please consider follow up with your Primary Care Provider.   The [facility] providers would like to encourage you to use [facility] to communicate with providers for non-urgent requests or questions.  Due to long hold times on the telephone, sending your provider a message by [facility] may be faster and more efficient way to get a response. Please allow 48 business hours for a response.  Please remember that this is for non-urgent requests/questions.  Once you have your new insurance card next month, take a picture of the front and back and send it to [place] on MyChart. Once we have that entered in the system, Nekeisha Aure, CMA will call you to schedule the colonoscopy and will send the instructions to you on MyChart.  Call us if the lower left abdominal pain recurs.  It was great seeing you today! Thank you for entrusting me with your care and choosing [facility].  Arnaldo Natal, CRNP

## 2020-09-21 ENCOUNTER — Telehealth: Payer: Self-pay

## 2020-09-21 NOTE — Progress Notes (Signed)
I agree with the above note, plan.  Certainly I am happy to offer him a sooner spot if one opens up.

## 2020-09-21 NOTE — Telephone Encounter (Signed)
-----   Message from Rachael Fee, MD sent at 09/21/2020  9:06 AM EDT ----- I don't recall hearing about this around the 8/15 phone call. That doesn't mean I wasn't invovled but I certainly done remember it.   I would love to help with a colonoscopy before his deadline but I only have one [facility] day between now and then and it is full already. If anything opens up sooner he should certainly be offered the spot.  Tracking that type of thing is very difficult as you know however.  I will include Gizella Belleville on this.  Thanks  ----- Message ----- From: Arnaldo Natal, NP Sent: 09/21/2020   8:36 AM EDT To: Rachael Fee, MD  Hi Dr. Christella Hartigan, refer to 8/10 and 9/1 office visit.   I saw Mr. Andrew Mckay yesterday and his colonoscopy was cancelled due to recurrent LLQ pain. See phone note 8/15. I was not on that phone trail and I was not aware he had recurrent LLQ pain or that his colonoscopy was cancelled until I received a mychart msg from patient on 8/23.  I just wanted to check with you regarding the 8/15 phone call, did Lauren speak to you about this patient at that time? (The phone note doesn't have any input from you) and I am concerned that the patient had LLQ pain and he did not have instruction for labs, CT or tx discussion.

## 2020-09-21 NOTE — Telephone Encounter (Signed)
Patient was not scheduled yesterday because he needed it done before 10/06/20 due to his insurance changing after 10/06/20. Too avoid confusion with insurance changes and pre-certification issues I discussed with the patient that once he gets his new insurance, he will send a front and back picture of the insurance card to me on MyChart so we can enter it in and update his chart. Once that is done I will call him to schedule the colonoscopy and send him the instructions through MyChart.    Patient agreed with the plan of action and fully understood. I have also set myself a reminder.

## 2020-10-15 DIAGNOSIS — D2272 Melanocytic nevi of left lower limb, including hip: Secondary | ICD-10-CM | POA: Diagnosis not present

## 2020-10-15 DIAGNOSIS — D2371 Other benign neoplasm of skin of right lower limb, including hip: Secondary | ICD-10-CM | POA: Diagnosis not present

## 2020-10-15 DIAGNOSIS — D225 Melanocytic nevi of trunk: Secondary | ICD-10-CM | POA: Diagnosis not present

## 2020-10-15 DIAGNOSIS — D2271 Melanocytic nevi of right lower limb, including hip: Secondary | ICD-10-CM | POA: Diagnosis not present

## 2021-07-05 ENCOUNTER — Other Ambulatory Visit: Payer: Self-pay | Admitting: Internal Medicine

## 2021-07-05 DIAGNOSIS — E785 Hyperlipidemia, unspecified: Secondary | ICD-10-CM

## 2021-07-09 ENCOUNTER — Other Ambulatory Visit (HOSPITAL_COMMUNITY): Payer: Self-pay | Admitting: Internal Medicine

## 2021-07-09 DIAGNOSIS — R609 Edema, unspecified: Secondary | ICD-10-CM

## 2021-07-11 ENCOUNTER — Ambulatory Visit (HOSPITAL_COMMUNITY): Payer: Medicare Other | Attending: Internal Medicine

## 2021-07-11 DIAGNOSIS — I340 Nonrheumatic mitral (valve) insufficiency: Secondary | ICD-10-CM | POA: Diagnosis not present

## 2021-07-11 DIAGNOSIS — I10 Essential (primary) hypertension: Secondary | ICD-10-CM | POA: Insufficient documentation

## 2021-07-11 DIAGNOSIS — I080 Rheumatic disorders of both mitral and aortic valves: Secondary | ICD-10-CM | POA: Diagnosis not present

## 2021-07-11 DIAGNOSIS — R609 Edema, unspecified: Secondary | ICD-10-CM | POA: Insufficient documentation

## 2021-07-11 LAB — ECHOCARDIOGRAM COMPLETE
Area-P 1/2: 5.2 cm2
P 1/2 time: 890 msec
S' Lateral: 4.1 cm

## 2021-08-18 NOTE — Progress Notes (Unsigned)
No chief complaint on file.  History of Present Illness: 66 yo male with history of HTN, aortic valve insufficiency, bradycardia here today as a new patient to establish cardiology care. He had an echo on 07/11/21 that showed LVEF=60-65% with moderate dilation of the LV. Normal RV size and function. Mild mitral regurgitation. Mild to moderate aortic valve insufficiency. He tells me today that he ***  Primary Care Physician: Rodrigo Ran, MD  Past Medical History:  Diagnosis Date   Aortic valve insufficiency    Bradycardia    Diverticulosis of colon    Edema    Hypertension    Mild aortic insufficiency    Skin lesions    Dr. Nita Sells removed, none biospied, face, torso   Tinnitus of both ears    Vitamin D deficiency, unspecified 12/09/2013    Past Surgical History:  Procedure Laterality Date   APPENDECTOMY     age 65   COLONOSCOPY  12-14-06   per Dr. Christella Hartigan, few diverticulae, no polyps, repeat in 10 yrs   MOLE REMOVAL     x several   TONSILLECTOMY AND ADENOIDECTOMY     age 71    Current Outpatient Medications  Medication Sig Dispense Refill   Cholecalciferol (QC VITAMIN D3) 50 MCG (2000 UT) TABS Take 1 tablet by mouth daily.     irbesartan (AVAPRO) 75 MG tablet Take 1 tablet by mouth at bedtime.     Multiple Vitamins-Minerals (DAILY MULTIVITAMIN) CAPS Take 1 capsule by mouth daily.     No current facility-administered medications for this visit.    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    Employer: [employer]  Tobacco Use   Smoking status: Former   Smokeless tobacco: Never  Building services engineer Use: Never used  Substance and  Sexual Activity   Alcohol use: Yes    Comment: occ   Drug use: No   Sexual activity: Not on file  Other Topics Concern   Not on file  Social History Narrative   Not on file   Social Determinants of Health   Financial Resource Strain: Not on file  Food Insecurity: Not on file  Transportation Needs: Not on file  Physical Activity: Not on file  Stress: Not on file  Social Connections: Not on file  Intimate Partner Violence: Not on file    Family History  Adopted: Yes  Problem Relation Age of Onset   Colon polyps Neg Hx    Rectal cancer Neg Hx     Review of Systems:  As stated in the HPI and otherwise negative.   There were no vitals taken for this visit.  Physical Examination: General: Well developed, well nourished, NAD  HEENT: OP clear, mucus membranes moist  SKIN: warm, dry. No rashes. Neuro: No focal deficits  Musculoskeletal: Muscle strength 5/5 all ext  Psychiatric: Mood and affect normal  Neck: No JVD, no carotid bruits, no thyromegaly, no lymphadenopathy.  Lungs:Clear bilaterally, no wheezes, rhonci, crackles Cardiovascular: Regular rate and rhythm. No murmurs, gallops or rubs. Abdomen:Soft. Bowel sounds present. Non-tender.  Extremities: No lower extremity edema. Pulses are 2 +  in the bilateral DP/PT.  EKG:  EKG {ACTION; IS/IS [id number]} ordered today. The ekg ordered today demonstrates ***  Echo 07/11/21:   1. Global longitudinal strain is -19.6%. Left ventricular ejection  fraction, by estimation, is 55 to 60%. The left ventricle has normal  function. The left ventricle has no regional wall motion abnormalities.  The left ventricular internal cavity size was   moderately dilated. Left ventricular diastolic parameters were normal.   2. Right ventricular systolic function is normal. The right ventricular  size is normal.   3. The mitral valve is normal in structure. Mild mitral valve  regurgitation.   4. AI jet is central, directed posteriorly . The  aortic valve is  tricuspid. Aortic valve regurgitation is mild to moderate. Aortic valve  sclerosis is present, with no evidence of aortic valve stenosis.   5. The inferior vena cava is normal in size with greater than 50%  respiratory variability, suggesting right atrial pressure of 3 mmHg.   Recent Labs: No results found for requested labs within last 365 days.   Lipid Panel    Component Value Date/Time   CHOL 143 05/10/2012 0819   TRIG 75.0 05/10/2012 0819   HDL 31.70 (L) 05/10/2012 0819   CHOLHDL 5 05/10/2012 0819   VLDL 15.0 05/10/2012 0819   LDLCALC 96 05/10/2012 0819     Wt Readings from Last 3 Encounters:  09/20/20 194 lb 8 oz (88.2 kg)  08/29/20 195 lb 12.8 oz (88.8 kg)  08/29/13 198 lb (89.8 kg)     Assessment and Plan:   1. Aortic valve insufficiency: ***  2. Mitral regurgitation:   Labs/ tests ordered today include:  No orders of the defined types were placed in this encounter.    Disposition:   F/U with me in ***.    Signed, Verne Carrow, MD 08/18/2021 9:21 AM    [facility] [address] [place], [place], [place]  [address] Phone: [phone]; Fax: [phone]

## 2021-08-19 ENCOUNTER — Encounter: Payer: Self-pay | Admitting: Cardiovascular Disease

## 2021-08-19 ENCOUNTER — Ambulatory Visit (INDEPENDENT_AMBULATORY_CARE_PROVIDER_SITE_OTHER): Payer: Medicare Other | Admitting: Cardiovascular Disease

## 2021-08-19 VITALS — BP 150/70 | HR 48 | Ht 74.0 in | Wt 197.6 lb

## 2021-08-19 DIAGNOSIS — I340 Nonrheumatic mitral (valve) insufficiency: Secondary | ICD-10-CM | POA: Diagnosis not present

## 2021-08-19 DIAGNOSIS — I872 Venous insufficiency (chronic) (peripheral): Secondary | ICD-10-CM | POA: Diagnosis not present

## 2021-08-19 DIAGNOSIS — I351 Nonrheumatic aortic (valve) insufficiency: Secondary | ICD-10-CM

## 2021-08-19 DIAGNOSIS — R001 Bradycardia, unspecified: Secondary | ICD-10-CM

## 2021-08-19 MED ORDER — FUROSEMIDE 20 MG PO TABS: 20.0000 mg | ORAL_TABLET | Freq: Every day | ORAL | 3 refills | Status: DC | PRN

## 2021-08-19 NOTE — Patient Instructions (Signed)
Medication Instructions:  Your physician has recommended you make the following change in your medication:  1.) furosemide (Lasix) 20 mg - take one tablet daily as needed for swelling  *If you need a refill on your cardiac medications before your next appointment, please call your pharmacy*   Lab Work: none   Testing/Procedures: ECHO DUE IN ONE YEAR Your physician has requested that you have an echocardiogram. Echocardiography is a painless test that uses sound waves to create images of your heart. It provides your doctor with information about the size and shape of your heart and how well your heart's chambers and valves are working. This procedure takes approximately one hour. There are no restrictions for this procedure.   Follow-Up: At [facility], you and your health needs are our priority.  As part of our continuing mission to provide you with exceptional heart care, we have created designated Provider Care Teams.  These Care Teams include your primary Cardiologist (physician) and Advanced Practice Providers (APPs -  Physician Assistants and Nurse Practitioners) who all work together to provide you with the care you need, when you need it.   Your next appointment:   12 month(s)  The format for your next appointment:   In Person  Provider:   Verne Carrow, MD   Important Information About Sugar

## 2021-08-26 ENCOUNTER — Ambulatory Visit
Admission: RE | Admit: 2021-08-26 | Discharge: 2021-08-26 | Disposition: A | Discharge status: Home / Self Care | Payer: Medicare Other | Source: Ambulatory Visit | Attending: Internal Medicine | Admitting: Internal Medicine

## 2021-08-26 DIAGNOSIS — E785 Hyperlipidemia, unspecified: Secondary | ICD-10-CM

## 2022-08-14 ENCOUNTER — Ambulatory Visit (HOSPITAL_COMMUNITY): Payer: Medicare Other | Attending: Cardiovascular Disease

## 2022-08-14 DIAGNOSIS — I351 Nonrheumatic aortic (valve) insufficiency: Secondary | ICD-10-CM | POA: Insufficient documentation

## 2022-08-14 DIAGNOSIS — I340 Nonrheumatic mitral (valve) insufficiency: Secondary | ICD-10-CM | POA: Diagnosis not present

## 2022-08-14 DIAGNOSIS — R001 Bradycardia, unspecified: Secondary | ICD-10-CM | POA: Insufficient documentation

## 2022-08-14 DIAGNOSIS — I872 Venous insufficiency (chronic) (peripheral): Secondary | ICD-10-CM | POA: Insufficient documentation

## 2022-08-14 LAB — ECHOCARDIOGRAM COMPLETE
Area-P 1/2: 2.8 cm2
P 1/2 time: 603 msec
S' Lateral: 4.4 cm

## 2022-09-03 ENCOUNTER — Other Ambulatory Visit (HOSPITAL_COMMUNITY): Payer: Self-pay | Admitting: Otolaryngology

## 2022-09-03 ENCOUNTER — Ambulatory Visit (HOSPITAL_COMMUNITY)
Admission: RE | Admit: 2022-09-03 | Discharge: 2022-09-03 | Disposition: A | Discharge status: Home / Self Care | Payer: Medicare Other | Source: Ambulatory Visit | Attending: Otolaryngology | Admitting: Otolaryngology

## 2022-09-03 DIAGNOSIS — C44329 Squamous cell carcinoma of skin of other parts of face: Secondary | ICD-10-CM

## 2022-09-03 DIAGNOSIS — G500 Trigeminal neuralgia: Secondary | ICD-10-CM

## 2022-09-03 MED ORDER — GADOBUTROL 1 MMOL/ML IV SOLN
10.0000 mL | Freq: Once | INTRAVENOUS | Status: AC | PRN
  Administered 2022-09-03: 10 mL via INTRAVENOUS

## 2022-09-24 ENCOUNTER — Encounter: Payer: Self-pay | Admitting: Neurology

## 2022-09-24 ENCOUNTER — Ambulatory Visit (INDEPENDENT_AMBULATORY_CARE_PROVIDER_SITE_OTHER): Payer: Medicare Other | Admitting: Neurology

## 2022-09-24 VITALS — BP 145/76 | HR 57 | Ht 74.0 in | Wt 198.0 lb

## 2022-09-24 DIAGNOSIS — S0431XA Injury of trigeminal nerve, right side, initial encounter: Secondary | ICD-10-CM | POA: Diagnosis not present

## 2022-09-24 NOTE — Progress Notes (Signed)
[facility]    Provider:  Dr Lucia Gaskins Requesting Provider: Scarlette Ar, MD Primary Care Provider:  Rodrigo Ran, MD  CC:  facial pain  HPI:  Andrew Mckay is a 67 y.o. male here as requested by Scarlette Ar, MD for TGN. has DIVERTICULOSIS, COLON; Elevated PSA; RLQ abdominal pain; Vitamin D deficiency, unspecified; Abnormal feces; Tinnitus; Proteinuria; and Elevated blood-pressure reading without diagnosis of hypertension on their problem list.  He is here alone. He had trouble in his jaw/TMJ on either side depending on which side he slept on, antibiotics helped. They saw an infection above the molars and treated and the symptoms improved. He started having possibly trigeminal pain as well. He was rubbing the water out of his eyes and started feeling burning left lower quadrant. 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 Started moving up the side of the left orbit, now it feel like sensitivity on the zygomatic, very sensitive only when touching and feels numb as well just on the left. No pain or shooting unless sleeping on it or touching , pressureon the zygomatic feels like a bruise September 2023. Progressively worsening even recentlyencompassing more area and moving up the outer side of the orbit.   Reviewed notes, labs and imaging from outside physicians, which showed:  MRI face TG nerve: 08/2022 CLINICAL DATA:  Left-sided facial sensation around cheek bone after squamous cell carcinoma removal.   EXAM: MRI FACE AND NECK WITHOUT AND WITH CONTRAST   TECHNIQUE: Multiplanar, multisequence MR imaging was performed both before and after administration of intravenous contrast.   CONTRAST:  10mL GADAVIST GADOBUTROL 1 MMOL/ML IV SOLN   COMPARISON:  MR head 01/09/2007   FINDINGS: MR NECK FINDINGS   Pharynx and larynx: The nasal cavity and nasopharynx  are unremarkable.   The oral cavity and oropharynx are unremarkable. The parapharyngeal spaces are clear.   The hypopharynx and larynx are unremarkable. The vocal folds are normal in appearance.   The epiglottis is normal. There is no retropharyngeal collection. The airway is patent.   Salivary glands: The parotid and submandibular glands are unremarkable.   Thyroid: Unremarkable.   Lymph nodes: There is no pathologic lymphadenopathy in the neck.   Vascular: The major flow voids are normal.   Skeleton: There is no acute osseous abnormality or suspicious osseous lesion.   Upper chest: Unremarkable.   Other: None.   MR FACE FINDINGS   Limited intracranial/Trigeminal nerves: The imaged brain is unremarkable. The trigeminal nerves are unremarkable, with no abnormal enhancement. There is no mass lesion or mass effect along the course of the nerves. The internal auditory canals and inner ear structures are normal. The pituitary and suprasellar region are normal.   Vascular: The major flow voids are normal.   Sinuses/Orbits: There is mild mucosal thickening in the paranasal sinuses. The mastoid air cells are clear.   Soft tissues: The soft tissues are unremarkable. Specifically, there is no acute or suspicious finding in the soft tissues in the left face in the reported area of clinical concern.   Osseous: There is no acute osseous abnormality or suspicious osseous lesion.   Other: None.   IMPRESSION: 1. No acute or suspicious findings in the face or neck. Specifically, no finding in the region of the left cheek to correspond to the reported site of clinical concern. 2. No mass lesion or pathologic lymphadenopathy in the neck.    MR  neck, soft tissue:  Narrative & Impression  CLINICAL DATA:  Left-sided facial sensation around cheek bone after squamous cell carcinoma removal.   EXAM: MRI FACE AND NECK WITHOUT AND WITH CONTRAST   TECHNIQUE: Multiplanar,  multisequence MR imaging was performed both before and after administration of intravenous contrast.   CONTRAST:  10mL GADAVIST GADOBUTROL 1 MMOL/ML IV SOLN   COMPARISON:  MR head 01/09/2007   FINDINGS: MR NECK FINDINGS   Pharynx and larynx: The nasal cavity and nasopharynx are unremarkable.   The oral cavity and oropharynx are unremarkable. The parapharyngeal spaces are clear.   The hypopharynx and larynx are unremarkable. The vocal folds are normal in appearance.   The epiglottis is normal. There is no retropharyngeal collection. The airway is patent.   Salivary glands: The parotid and submandibular glands are unremarkable.   Thyroid: Unremarkable.   Lymph nodes: There is no pathologic lymphadenopathy in the neck.   Vascular: The major flow voids are normal.   Skeleton: There is no acute osseous abnormality or suspicious osseous lesion.   Upper chest: Unremarkable.   Other: None.   MR FACE FINDINGS   Limited intracranial/Trigeminal nerves: The imaged brain is unremarkable. The trigeminal nerves are unremarkable, with no abnormal enhancement. There is no mass lesion or mass effect along the course of the nerves. The internal auditory canals and inner ear structures are normal. The pituitary and suprasellar region are normal.   Vascular: The major flow voids are normal.   Sinuses/Orbits: There is mild mucosal thickening in the paranasal sinuses. The mastoid air cells are clear.   Soft tissues: The soft tissues are unremarkable. Specifically, there is no acute or suspicious finding in the soft tissues in the left face in the reported area of clinical concern.   Osseous: There is no acute osseous abnormality or suspicious osseous lesion.   Other: None.   IMPRESSION: 1. No acute or suspicious findings in the face or neck. Specifically, no finding in the region of the left cheek to correspond to the reported site of clinical concern. 2. No mass lesion or  pathologic lymphadenopathy in the neck.     Electronically Signed   By: Lesia Hausen M.D.   On: 09/03/2022 16:51     Recent Results (from the past 2160 hour(s))  ECHOCARDIOGRAM COMPLETE     Status: None   Collection Time: 08/14/22  9:05 AM  Result Value Ref Range   Area-P 1/2 2.80 cm2   S' Lateral 4.40 cm   P 1/2 time 603 msec   Est EF 50 - 55%      Review of Systems: Patient complains of symptoms per HPI as well as the following symptoms none. Pertinent negatives and positives per HPI. All others negative.   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    Employer: [employer]   Occupation: Retired from [employer]  Tobacco Use   Smoking status: Former    Current packs/day: 0.00    Types: Cigarettes    Quit date: 07/20/1995    Years since quitting: 27.2   Smokeless tobacco: Never  Vaping Use   Vaping status: Never Used  Substance and Sexual Activity   Alcohol use: Yes    Comment: occ  Drug use: No   Sexual activity: Not on file  Other Topics Concern   Not on file  Social History Narrative   Not on file   Social Determinants of Health   Financial Resource Strain: Not on file  Food Insecurity: Not on file  Transportation Needs: Not on file  Physical Activity: Not on file  Stress: Not on file  Social Connections: Not on file  Intimate Partner Violence: Not on file    Family History  Adopted: Yes  Problem Relation Age of Onset   Colon polyps Neg Hx    Rectal cancer Neg Hx     Past Medical History:  Diagnosis Date   Aortic valve insufficiency    Bradycardia    Diverticulosis of colon    Edema    Hypertension    Mild aortic insufficiency    Skin lesions    Dr. Nita Sells removed, none biospied, face, torso   Tinnitus of  both ears    Vitamin D deficiency, unspecified 12/09/2013    Patient Active Problem List   Diagnosis Date Noted   Abnormal feces 08/28/2020   Elevated blood-pressure reading without diagnosis of hypertension 05/03/2018   Proteinuria 02/13/2016   Vitamin D deficiency, unspecified 12/09/2013   Tinnitus 12/08/2013   RLQ abdominal pain 05/24/2012   Elevated PSA 05/17/2012   DIVERTICULOSIS, COLON 12/25/2008    Past Surgical History:  Procedure Laterality Date   APPENDECTOMY     age 4   COLONOSCOPY  12/14/2006   per Dr. Christella Hartigan, few diverticulae, no polyps, repeat in 10 yrs   MOLE REMOVAL     x several   SQUAMOUS CELL CARCINOMA EXCISION Left    facial   TONSILLECTOMY AND ADENOIDECTOMY     age 65    Current Outpatient Medications  Medication Sig Dispense Refill   Cholecalciferol (QC VITAMIN D3) 50 MCG (2000 UT) TABS Take 1 tablet by mouth daily.     cyanocobalamin (VITAMIN B12) 1000 MCG tablet Take 1,000 mcg by mouth daily.     hydrochlorothiazide (HYDRODIURIL) 25 MG tablet TAKE 1 TABLET BY MOUTH EVERY DAY IN THE MORNING FOR 90 DAYS for 90     Multiple Vitamins-Minerals (DAILY MULTIVITAMIN) CAPS Take 1 capsule by mouth daily.     potassium chloride (KLOR-CON) 10 MEQ tablet 1 tablet with food Orally three days a week on Monday, Wednesday, and Friday     No current facility-administered medications for this visit.    Allergies as of 09/24/2022 - Review Complete 09/24/2022  Allergen Reaction Noted   Sudafed [pseudoephedrine] Other (See Comments) 08/29/2020    Vitals: BP (!) 145/76   Pulse (!) 57   Ht 6\' 2"  (1.88 m)   Wt 198 lb (89.8 kg)   BMI 25.42 kg/m  Last Weight:  Wt Readings from Last 1 Encounters:  09/24/22 198 lb (89.8 kg)   Last Height:   Ht Readings from Last 1 Encounters:  09/24/22 6\' 2"  (1.88 m)     Physical exam: Exam: Gen: NAD, conversant, well nourised,  well groomed                     CV: RRR, no MRG. No Carotid Bruits. No peripheral edema, warm,  nontender Eyes: Conjunctivae clear without exudates or hemorrhage  Neuro: Detailed Neurologic Exam  Speech:    Speech is normal; fluent and spontaneous with normal comprehension.  Cognition:    The patient is oriented to person, place, and time;  recent and remote memory intact;     language fluent;     normal attention, concentration,     fund of knowledge Cranial Nerves:    The pupils are equal, round, and reactive to light. Flat ONH Visual fields are full to finger confrontation. Extraocular movements are intact. Trigeminal sensation is intact and the muscles of mastication are normal. The face is symmetric. The palate elevates in the midline. Hearing intact. Voice is normal. Shoulder shrug is normal. The tongue has normal motion without fasciculations.   Coordination:    Normal finger to nose and heel to shin. Normal rapid alternating movements.   Gait:    Heel-toe and tandem gait are normal.   Motor Observation:    No asymmetry, no atrophy, and no involuntary movements noted. Tone:    Normal muscle tone.    Posture:    Posture is normal. normal erect    Strength:    Strength is V/V in the upper and lower limbs.      Sensation: intact to LT     Reflex Exam:  DTR's:    Deep tendon reflexes in the upper and lower extremities are normal bilaterally.   Toes:    The toes are downgoing bilaterally.   Clonus:    Clonus is absent.    Assessment/Plan:  Patient has tenderness only when palpating area where he had a squamous cell carcinoma removed, likely resultant from that. The tenderness is exactly where the cell was removed as below, nerve trauma which is unavoidable. MRI face did not show anything such as inflammation or signs of perineural spread, not trigeminal neuralgia.Botox likely won't help, could send to a facial surgeon to see if he can perform neurolysis or other procedure but patient reports the pain is tolerable and only if he pressess, can follow it  clinically possibly may improve over time  Surgical Area:    Area of pain    Cc: Scarlette Ar, MD,  Rodrigo Ran, MD  Naomie Dean, MD  [facility] [address] [place], [place] [address]  Phone [phone] Fax [phone]

## 2022-09-24 NOTE — Patient Instructions (Signed)
Likely trauma from the squamous cell removal, come back if worsening

## 2022-12-26 DIAGNOSIS — I10 Essential (primary) hypertension: Secondary | ICD-10-CM | POA: Insufficient documentation

## 2022-12-26 DIAGNOSIS — Z79899 Other long term (current) drug therapy: Secondary | ICD-10-CM | POA: Diagnosis not present

## 2022-12-26 DIAGNOSIS — S0101XA Laceration without foreign body of scalp, initial encounter: Secondary | ICD-10-CM | POA: Diagnosis not present

## 2022-12-26 DIAGNOSIS — S0990XA Unspecified injury of head, initial encounter: Secondary | ICD-10-CM | POA: Diagnosis present

## 2022-12-26 DIAGNOSIS — W01198A Fall on same level from slipping, tripping and stumbling with subsequent striking against other object, initial encounter: Secondary | ICD-10-CM | POA: Insufficient documentation

## 2022-12-27 ENCOUNTER — Other Ambulatory Visit: Payer: Self-pay

## 2022-12-27 ENCOUNTER — Emergency Department (HOSPITAL_COMMUNITY)
Admission: EM | Admit: 2022-12-27 | Discharge: 2022-12-27 | Disposition: A | Discharge status: Home / Self Care | Payer: Medicare Other | Attending: Emergency Medicine | Admitting: Emergency Medicine

## 2022-12-27 ENCOUNTER — Encounter (HOSPITAL_COMMUNITY): Payer: Self-pay

## 2022-12-27 DIAGNOSIS — S0101XA Laceration without foreign body of scalp, initial encounter: Secondary | ICD-10-CM

## 2022-12-27 NOTE — ED Triage Notes (Addendum)
Pt states that he tripped on a step and fell into the wall around 1030 pm. Pt has a laceration to his forehead.

## 2022-12-27 NOTE — Discharge Instructions (Signed)
Dermabond will slough off over the next few days, try not to pull it off. Can shower/bathe as normal. Once dermabond fully off, can use mederma to help reduce scarring. Follow-up with your doctor if any ongoing issues. Return here for new concerns.

## 2022-12-27 NOTE — ED Provider Notes (Signed)
[place] EMERGENCY DEPARTMENT AT [facility] Provider Note   CSN: [id number] Arrival date & time: 12/26/22  2313     History  Chief Complaint  Patient presents with   Head Laceration    Andrew Mckay is a 67 y.o. male.  The history is provided by the patient and medical records.  Head Laceration   67 y.o. M with history of diverticulosis, aortic insufficiency, hypertension, presenting to the ED with a forehead laceration.  Patient states he was walking in the house with his hands full when he tripped and struck his head on corner of a brick wall.  He was wearing a hat which seem to have blunted his injury.  There was no loss of consciousness.  States he feels fine, but wound continues to ooze a little bit of blood.  He is not on anticoagulation.  He denies any dizziness, numbness, confusion, significant headache, nausea, or vomiting.  Tetanus is up-to-date.  Home Medications Prior to Admission medications   Medication Sig Start Date End Date Taking? Authorizing Provider  Cholecalciferol (QC VITAMIN D3) 50 MCG (2000 UT) TABS Take 1 tablet by mouth daily. 01/04/15   [provider]  cyanocobalamin (VITAMIN B12) 1000 MCG tablet Take 1,000 mcg by mouth daily.    [provider]  hydrochlorothiazide (HYDRODIURIL) 25 MG tablet TAKE 1 TABLET BY MOUTH EVERY DAY IN THE MORNING FOR 90 DAYS for 90 01/20/21   [provider]  Multiple Vitamins-Minerals (DAILY MULTIVITAMIN) CAPS Take 1 capsule by mouth daily. 05/09/19   [provider]  potassium chloride (KLOR-CON) 10 MEQ tablet 1 tablet with food Orally three days a week on Monday, Wednesday, and Friday 09/09/22   [provider]      Allergies    Sudafed [pseudoephedrine]    Review of Systems   Review of Systems  Skin:  Positive for wound.  All other systems reviewed and are negative.   Physical Exam Updated Vital Signs BP (!) 165/72 (BP Location: Right Arm)   Pulse (!) 53    Temp (!) 97.5 F (36.4 C) (Oral)   Resp 18   Ht 6\' 2"  (1.88 m)   Wt 89.8 kg   SpO2 99%   BMI 25.42 kg/m  Physical Exam Vitals and nursing note reviewed.  Constitutional:      Appearance: He is well-developed.  HENT:     Head: Normocephalic and atraumatic.      Comments: 2cm laceration to mid-forehead, very superficial, skin remains welll approximated without gaping, minimal bleeding, associated abrasions without hematoma or bruising, no skull depression Eyes:     Conjunctiva/sclera: Conjunctivae normal.     Pupils: Pupils are equal, round, and reactive to light.  Cardiovascular:     Rate and Rhythm: Normal rate and regular rhythm.     Heart sounds: Normal heart sounds.  Pulmonary:     Effort: Pulmonary effort is normal.     Breath sounds: Normal breath sounds.  Abdominal:     General: Bowel sounds are normal.     Palpations: Abdomen is soft.  Musculoskeletal:        General: Normal range of motion.     Cervical back: Normal range of motion.  Skin:    General: Skin is warm and dry.  Neurological:     Mental Status: He is alert and oriented to person, place, and time.     Comments: AAOx3, answering questions and following commands appropriately; equal strength UE and LE bilaterally; CN grossly  intact; moves all extremities appropriately without ataxia; no focal neuro deficits or facial asymmetry appreciated     ED Results / Procedures / Treatments   Labs (all labs ordered are listed, but only abnormal results are displayed) Labs Reviewed - No data to display  EKG None  Radiology No results found.  Procedures Procedures    LACERATION REPAIR Performed by: Garlon Hatchet Authorized by: Garlon Hatchet Consent: Verbal consent obtained. Risks and benefits: risks, benefits and alternatives were discussed Consent given by: patient Patient identity confirmed: provided demographic data Prepped and Draped in normal sterile fashion Wound explored  Laceration  Location: forehead  Laceration Length: 2cm, superficial  No Foreign Bodies seen or palpated  Anesthesia: none  Local anesthetic: none  Anesthetic total: 0 ml  Irrigation method: syringe Amount of cleaning: standard  Skin closure: dermabond  Number of sutures: 0  Technique: n/a  Patient tolerance: Patient tolerated the procedure well with no immediate complications.   Medications Ordered in ED Medications - No data to display  ED Course/ Medical Decision Making/ A&P                                 Medical Decision Making  67 year old male presenting to the ED for forehead laceration.  He tripped while his hands were fall and struck his head against the corner of a brick building.  He was wearing a hat which seems to have blunted his injury.  There was no loss of consciousness.  He is not on anticoagulation.  Tetanus is up-to-date.  He is awake, alert, oriented here.  He does not have any focal neurologic deficits.  He denies any headache, confusion, dizziness, nausea, or vomiting.  No concussion type symptoms.  Do not feel he needs emergent head CT given reassuring exam, no anticoagulation use, stable vitals, etc.  Laceration is very superficial, skin remains well-approximated without any gaping.  There is scant amount of bleeding.  Opted to repair with Dermabond.  We discussed home wound care for now, can use mederma afterwards to help reduce scarring.  Can follow-up with PCP.  Return here for new concerns.  Final Clinical Impression(s) / ED Diagnoses Final diagnoses:  Laceration of scalp without foreign body, initial encounter    Rx / DC Orders ED Discharge Orders     None         Garlon Hatchet, PA-C 12/27/22 0427    Zadie Rhine, MD 12/27/22 [phone]

## 2023-06-01 ENCOUNTER — Encounter: Payer: Self-pay | Admitting: Neurology

## 2023-06-01 NOTE — Telephone Encounter (Signed)
 I called pt after seeing his email about relating to pain, numbness, and sensitivity issues have gotten worse and having difficulty sleeping due to pain. Additionally, left side of face is now drooping with inability to smile on left side and inability to lift left upper lip. The smile/lip issues developed over past 1-2 weeks. He states does not feel like stroke, he know what that is (he is not having any other stroke symptoms , although headaches for the lat year due to trigeminal nerve).  He has seen [facility] and this is prior to droopiness.  I told him that we would have him seen acutely at urgent care / ED since these are new symptoms and he is wanting to be see acutely.  I relayed that I'm happy to relay to Dr.  Tresia Fruit and let her know.  He was last seen 09-2022.  He said [facility] referred him back to  Neurology.  ([facility] 11/2023 this prior to droop).

## 2023-06-09 ENCOUNTER — Encounter: Payer: Self-pay | Admitting: Otolaryngology

## 2023-06-10 ENCOUNTER — Emergency Department (HOSPITAL_COMMUNITY)
Admission: EM | Admit: 2023-06-10 | Discharge: 2023-06-10 | Disposition: A | Discharge status: Home / Self Care | Attending: Emergency Medicine | Admitting: Emergency Medicine

## 2023-06-10 ENCOUNTER — Other Ambulatory Visit: Payer: Self-pay

## 2023-06-10 ENCOUNTER — Other Ambulatory Visit: Payer: Self-pay | Admitting: Otolaryngology

## 2023-06-10 ENCOUNTER — Emergency Department (HOSPITAL_COMMUNITY)

## 2023-06-10 ENCOUNTER — Encounter (HOSPITAL_COMMUNITY): Payer: Self-pay

## 2023-06-10 DIAGNOSIS — R6889 Other general symptoms and signs: Secondary | ICD-10-CM | POA: Insufficient documentation

## 2023-06-10 DIAGNOSIS — Z7982 Long term (current) use of aspirin: Secondary | ICD-10-CM | POA: Insufficient documentation

## 2023-06-10 DIAGNOSIS — R29810 Facial weakness: Secondary | ICD-10-CM

## 2023-06-10 DIAGNOSIS — J449 Chronic obstructive pulmonary disease, unspecified: Secondary | ICD-10-CM | POA: Diagnosis not present

## 2023-06-10 DIAGNOSIS — I10 Essential (primary) hypertension: Secondary | ICD-10-CM | POA: Diagnosis not present

## 2023-06-10 DIAGNOSIS — Z7901 Long term (current) use of anticoagulants: Secondary | ICD-10-CM | POA: Diagnosis not present

## 2023-06-10 DIAGNOSIS — R519 Headache, unspecified: Secondary | ICD-10-CM

## 2023-06-10 DIAGNOSIS — H5712 Ocular pain, left eye: Secondary | ICD-10-CM

## 2023-06-10 DIAGNOSIS — J322 Chronic ethmoidal sinusitis: Secondary | ICD-10-CM

## 2023-06-10 DIAGNOSIS — R001 Bradycardia, unspecified: Secondary | ICD-10-CM | POA: Diagnosis present

## 2023-06-10 DIAGNOSIS — G510 Bell's palsy: Secondary | ICD-10-CM

## 2023-06-10 DIAGNOSIS — H532 Diplopia: Secondary | ICD-10-CM | POA: Diagnosis present

## 2023-06-10 DIAGNOSIS — G459 Transient cerebral ischemic attack, unspecified: Secondary | ICD-10-CM | POA: Diagnosis present

## 2023-06-10 LAB — URINALYSIS, ROUTINE W REFLEX MICROSCOPIC
Bilirubin Urine: NEGATIVE
Glucose, UA: NEGATIVE mg/dL
Hgb urine dipstick: NEGATIVE
Ketones, ur: NEGATIVE mg/dL
Leukocytes,Ua: NEGATIVE
Nitrite: NEGATIVE
Protein, ur: NEGATIVE mg/dL
Specific Gravity, Urine: 1.025 (ref 1.005–1.030)
pH: 5 (ref 5.0–8.0)

## 2023-06-10 LAB — CBC WITH DIFFERENTIAL/PLATELET
Abs Immature Granulocytes: 0.02 10*3/uL (ref 0.00–0.07)
Basophils Absolute: 0.1 10*3/uL (ref 0.0–0.1)
Basophils Relative: 1 %
Eosinophils Absolute: 0.2 10*3/uL (ref 0.0–0.5)
Eosinophils Relative: 3 %
HCT: 46.2 % (ref 39.0–52.0)
Hemoglobin: 16.5 g/dL (ref 13.0–17.0)
Immature Granulocytes: 0 %
Lymphocytes Relative: 24 %
Lymphs Abs: 1.3 10*3/uL (ref 0.7–4.0)
MCH: 32.5 pg (ref 26.0–34.0)
MCHC: 35.7 g/dL (ref 30.0–36.0)
MCV: 91.1 fL (ref 80.0–100.0)
Monocytes Absolute: 0.5 10*3/uL (ref 0.1–1.0)
Monocytes Relative: 8 %
Neutro Abs: 3.6 10*3/uL (ref 1.7–7.7)
Neutrophils Relative %: 64 %
Platelets: 177 10*3/uL (ref 150–400)
RBC: 5.07 MIL/uL (ref 4.22–5.81)
RDW: 13.2 % (ref 11.5–15.5)
WBC: 5.6 10*3/uL (ref 4.0–10.5)
nRBC: 0 % (ref 0.0–0.2)

## 2023-06-10 LAB — COMPREHENSIVE METABOLIC PANEL WITH GFR
ALT: 17 U/L (ref 0–44)
AST: 18 U/L (ref 15–41)
Albumin: 3.7 g/dL (ref 3.5–5.0)
Alkaline Phosphatase: 67 U/L (ref 38–126)
Anion gap: 9 (ref 5–15)
BUN: 19 mg/dL (ref 8–23)
CO2: 28 mmol/L (ref 22–32)
Calcium: 9.6 mg/dL (ref 8.9–10.3)
Chloride: 102 mmol/L (ref 98–111)
Creatinine, Ser: 1.2 mg/dL (ref 0.61–1.24)
GFR, Estimated: >60 mL/min (ref >60)
Glucose, Bld: 84 mg/dL (ref 70–99)
Potassium: 4.4 mmol/L (ref 3.5–5.1)
Sodium: 139 mmol/L (ref 135–145)
Total Bilirubin: 0.9 mg/dL (ref 0.0–1.2)
Total Protein: 7 g/dL (ref 6.5–8.1)

## 2023-06-10 MED ORDER — VALACYCLOVIR HCL 1 G PO TABS: 1000.0000 mg | ORAL_TABLET | Freq: Three times a day (TID) | ORAL | 0 refills | Status: DC

## 2023-06-10 MED ORDER — GADOBUTROL 1 MMOL/ML IV SOLN
8.5000 mL | Freq: Once | INTRAVENOUS | Status: AC | PRN
  Administered 2023-06-10: 8.5 mL via INTRAVENOUS

## 2023-06-10 MED ORDER — PREDNISONE 20 MG PO TABS: ORAL_TABLET | ORAL | 0 refills | Status: DC

## 2023-06-10 MED ORDER — PREDNISONE 20 MG PO TABS
60.0000 mg | ORAL_TABLET | Freq: Once | ORAL | Status: AC
  Administered 2023-06-10: 60 mg via ORAL
  Filled 2023-06-10: qty 3

## 2023-06-10 NOTE — ED Provider Triage Note (Signed)
 Emergency Medicine Provider Triage Evaluation Note  Andrew Mckay , a 68 y.o. male  was evaluated in triage.  Pt complains of numbness to the left side of his face x 1.5 weeks. 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 Worsening pain to the left side of the face also asymmetry. Endorsing left sided headache.   Review of Systems  Positive: Numbness, headache,  Negative: Nausea, vomiting, vision changes  Physical Exam  BP (!) 161/79   Pulse 60   Temp 97.8 F (36.6 C)   Resp 16   Ht 6\' 2"  (1.88 m)   Wt 85.7 kg   SpO2 95%   BMI 24.27 kg/m  Gen:   Awake, no distress   Resp:  Normal effort  MSK:   Moves extremities without difficulty  Other:  Facial asymmetry, decreased sensation to the left zygomatic arch and left temple.  Smile is also uneven, able to raise BL eyebrows. Rest of neuro exam is benign.   Medical Decision Making  Medically screening exam initiated at 1:16 PM.  Appropriate orders placed.  Andrew Mckay was informed that the remainder of the evaluation will be completed by another provider, this initial triage assessment does not replace that evaluation, and the importance of remaining in the ED until their evaluation is complete.  Was treated for squama cell carcinoma to the left side of his face, has residual trigeminal neuralgia.  Being seen by multiple ENTs, does have a CT sinus ordered, however has not been done.  I did this study along with CT head to rule out small CVA.   Zeev Deakins, PA-C 06/10/23 1326

## 2023-06-10 NOTE — Discharge Instructions (Addendum)
 Please take the steroids and antivirals as prescribed.  Please follow-up with both your neurologist and ophthalmologist for this.  Take preservative-free lubricating eyedrops to try and help you with dry eye on the left.  Tape your eye shut at night when you go to sleep.

## 2023-06-10 NOTE — ED Notes (Signed)
 Patient transported to MRI

## 2023-06-10 NOTE — ED Provider Notes (Signed)
 [place] EMERGENCY DEPARTMENT AT [facility] Provider Note   CSN: [id number] Arrival date & time: 06/10/23  1237     History  Chief Complaint  Patient presents with   Numbness    Andrew Mckay is a 68 y.o. male.  68 yo M with a chief complaints of left-sided facial weakness and numbness and pain.  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  After this was removed he has had some numbness to the left side of his face.  Over time this is gotten worse and now he has pain there about the left temple and over the past 48 hours became weak on that left side.  He has been seen by neurology for this as well as ENT.  No obvious etiology is yet been discovered.  The patient also has seen an ophthalmologist for this.  He called his ENT about his new weakness and they encouraged him to come to the ED to make sure he did not have a stroke.  He feels like his left eye is blurry especially in the morning that seems to improve throughout the day.  He thinks is because he is having trouble closing that eye.  Denies rash to the side of the face.  Denies trauma.  Otherwise denies one-sided numbness or weakness or difficulty speech or swallowing.        Home Medications Prior to Admission medications   Medication Sig Start Date End Date Taking? Authorizing Provider  predniSONE (DELTASONE) 20 MG tablet 3 tabs po daily x 3 days, then 2 tabs x 3 days, then 1.5 tabs x 3 days, then 1 tab x 3 days, then 0.5 tabs x 3 days 06/10/23  Yes Taysha Majewski, DO  valACYclovir (VALTREX) 1000 MG tablet Take 1 tablet (1,000 mg total) by mouth 3 (three) times daily. 06/10/23  Yes Albertus Hughs, DO  Cholecalciferol (QC VITAMIN D3) 50 MCG (2000 UT) TABS Take 1 tablet by mouth daily. 01/04/15   [provider]  cyanocobalamin (VITAMIN B12) 1000 MCG tablet Take 1,000 mcg by mouth daily.    [provider]  hydrochlorothiazide (HYDRODIURIL) 25 MG tablet  TAKE 1 TABLET BY MOUTH EVERY DAY IN THE MORNING FOR 90 DAYS for 90 01/20/21   [provider]  Multiple Vitamins-Minerals (DAILY MULTIVITAMIN) CAPS Take 1 capsule by mouth daily. 05/09/19   [provider]  potassium chloride (KLOR-CON) 10 MEQ tablet 1 tablet with food Orally three days a week on Monday, Wednesday, and Friday 09/09/22   [provider]      Allergies    Other and Sudafed [pseudoephedrine]    Review of Systems   Review of Systems  Physical Exam Updated Vital Signs BP 139/89   Pulse 91   Temp (!) 97.5 F (36.4 C) (Oral)   Resp 17   Ht 6\' 2"  (1.88 m)   Wt 85.7 kg   SpO2 100%   BMI 24.27 kg/m  Physical Exam Vitals and nursing note reviewed.  Constitutional:      Appearance: He is well-developed.  HENT:     Head: Normocephalic and atraumatic.  Eyes:     Pupils: Pupils are equal, round, and reactive to light.  Neck:     Vascular: No JVD.  Cardiovascular:     Rate and Rhythm: Normal rate and regular rhythm.     Heart sounds: No murmur heard.    No friction rub. No gallop.  Pulmonary:     Effort: No respiratory distress.     Breath sounds: No wheezing.  Abdominal:     General: There is no distension.     Tenderness: There is no abdominal tenderness. There is no guarding or rebound.  Musculoskeletal:        General: Normal range of motion.     Cervical back: Normal range of motion and neck supple.  Skin:    Coloration: Skin is not pale.     Findings: No rash.  Neurological:     Mental Status: He is alert and oriented to person, place, and time.     Comments: Patient has decree sensation to the left side of his face compared to right.  He does have left-sided facial droop.  He also has some slow tracking with the left eye compared to the right.  No obvious upper or lower extremity weakness.  No obvious cerebellar signs.  Psychiatric:        Behavior: Behavior normal.     ED Results / Procedures / Treatments   Labs (all labs  ordered are listed, but only abnormal results are displayed) Labs Reviewed  URINALYSIS, ROUTINE W REFLEX MICROSCOPIC - Abnormal; Notable for the following components:      Result Value   APPearance HAZY (*)    All other components within normal limits  CBC WITH DIFFERENTIAL/PLATELET  COMPREHENSIVE METABOLIC PANEL WITH GFR    EKG None  Radiology MR Brain W and Wo Contrast Result Date: 06/10/2023 CLINICAL DATA:  [place] initial evaluation for acute neuro deficit, stroke suspected, diplopia. EXAM: MRI HEAD AND ORBITS WITHOUT AND WITH CONTRAST TECHNIQUE: Multiplanar, multiecho pulse sequences of the brain and surrounding structures were obtained without and with intravenous contrast. Multiplanar, multiecho pulse sequences of the orbits and surrounding structures were obtained including fat saturation techniques, before and after intravenous contrast administration. CONTRAST:  8.5mL GADAVIST  GADOBUTROL  1 MMOL/ML IV SOLN COMPARISON:  CT from earlier the same day. FINDINGS: MRI HEAD FINDINGS Brain: Cerebral volume within normal limits. No focal parenchymal signal abnormality or significant cerebral white matter disease. No evidence for acute or subacute infarct. No areas of chronic cortical infarction or other insult. No acute or chronic intracranial blood products. No mass lesion, midline shift, or mass effect. No hydrocephalus or extra-axial fluid collection. Pituitary gland and suprasellar region within normal limits. No abnormal enhancement. Vascular: Major intracranial vascular flow voids are maintained. Skull and upper cervical spine: Craniocervical junction within normal limits. Bone marrow signal intensity normal. No scalp soft tissue abnormality. Other: No significant mastoid effusion. MRI ORBITS FINDINGS Orbits: Globes are symmetric in size with normal appearance and morphology. Optic nerves symmetric. No abnormal edema or enhancement about either optic nerve to suggest acute optic neuritis. No  abnormality about either optic nerve sheath complex. Intraconal and extraconal fat maintained. Extra-ocular muscles symmetric and within normal limits. Lacrimal glands normal. No abnormality about the orbital apices or cavernous sinus. Optic chiasm within normal limits. Visualized sinuses: Mild mucosal thickening about the ethmoidal air cells. Paranasal sinuses are otherwise largely clear. Soft tissues: Unremarkable. IMPRESSION: Normal MRI of the brain and orbits. No evidence for acute optic neuritis or other abnormality. Electronically Signed   By: Virgia Griffins M.D.   On: 06/10/2023 19:21   MR ORBITS W WO CONTRAST Result Date: 06/10/2023 CLINICAL DATA:  [place] initial evaluation for acute neuro deficit, stroke suspected, diplopia. EXAM: MRI HEAD AND ORBITS WITHOUT AND WITH CONTRAST TECHNIQUE: Multiplanar, multiecho pulse sequences of the brain and  surrounding structures were obtained without and with intravenous contrast. Multiplanar, multiecho pulse sequences of the orbits and surrounding structures were obtained including fat saturation techniques, before and after intravenous contrast administration. CONTRAST:  8.5mL GADAVIST  GADOBUTROL  1 MMOL/ML IV SOLN COMPARISON:  CT from earlier the same day. FINDINGS: MRI HEAD FINDINGS Brain: Cerebral volume within normal limits. No focal parenchymal signal abnormality or significant cerebral white matter disease. No evidence for acute or subacute infarct. No areas of chronic cortical infarction or other insult. No acute or chronic intracranial blood products. No mass lesion, midline shift, or mass effect. No hydrocephalus or extra-axial fluid collection. Pituitary gland and suprasellar region within normal limits. No abnormal enhancement. Vascular: Major intracranial vascular flow voids are maintained. Skull and upper cervical spine: Craniocervical junction within normal limits. Bone marrow signal intensity normal. No scalp soft tissue abnormality. Other: No  significant mastoid effusion. MRI ORBITS FINDINGS Orbits: Globes are symmetric in size with normal appearance and morphology. Optic nerves symmetric. No abnormal edema or enhancement about either optic nerve to suggest acute optic neuritis. No abnormality about either optic nerve sheath complex. Intraconal and extraconal fat maintained. Extra-ocular muscles symmetric and within normal limits. Lacrimal glands normal. No abnormality about the orbital apices or cavernous sinus. Optic chiasm within normal limits. Visualized sinuses: Mild mucosal thickening about the ethmoidal air cells. Paranasal sinuses are otherwise largely clear. Soft tissues: Unremarkable. IMPRESSION: Normal MRI of the brain and orbits. No evidence for acute optic neuritis or other abnormality. Electronically Signed   By: Virgia Griffins M.D.   On: 06/10/2023 19:21   CT HEAD WO CONTRAST ( ) Result Date: 06/10/2023 CLINICAL DATA:  Transient ischemic attack, treated squamous cell carcinoma on left side of face with residual trigeminal or outer. EXAM: CT HEAD WITHOUT CONTRAST CT MAXILLOFACIAL WITHOUT CONTRAST TECHNIQUE: Multidetector CT imaging of the head and maxillofacial structures were performed using the standard protocol without intravenous contrast. Multiplanar CT image reconstructions of the maxillofacial structures were also generated. RADIATION DOSE REDUCTION: This exam was performed according to the departmental dose-optimization program which includes automated exposure control, adjustment of the mA and/or kV according to patient size and/or use of iterative reconstruction technique. COMPARISON:  MRI 09/03/2022. FINDINGS: CT HEAD FINDINGS Brain: No acute intracranial hemorrhage. No CT evidence of acute infarct. No edema, mass effect, or midline shift. The basilar cisterns are patent. Ventricles: The ventricles are normal. Vascular: No hyperdense vessel or unexpected calcification. Skull: No acute or aggressive finding. Other:  Mastoid air cells are clear. CT MAXILLOFACIAL FINDINGS Osseous: No fracture or mandibular dislocation. No destructive process. Orbits: Globes are intact. Lenses are normally located. Extraocular muscles and optic nerve sheath complexes are unremarkable. Normal appearance of the intraorbital fat. Sinuses: Mucosal thickening in the alveolar recesses of the maxillary sinuses, left greater than right. Additional mild mucosal thickening in the posterior right maxillary sinus. Mild scattered mucosal thickening in the ethmoid sinuses. No air-fluid levels. There is pneumatization of the optic struts and bilateral anterior clinoid processes. Soft tissues: The visualized facial soft tissues are without acute abnormality. IMPRESSION: No CT evidence of acute intracranial abnormality. Unremarkable appearance of the visualized maxillofacial bones. Electronically Signed   By: Denny Flack M.D.   On: 06/10/2023 15:43   CT SINUS WO CONTRAST Result Date: 06/10/2023 CLINICAL DATA:  Transient ischemic attack, treated squamous cell carcinoma on left side of face with residual trigeminal or outer. EXAM: CT HEAD WITHOUT CONTRAST CT MAXILLOFACIAL WITHOUT CONTRAST TECHNIQUE: Multidetector CT imaging of the head and maxillofacial structures were  performed using the standard protocol without intravenous contrast. Multiplanar CT image reconstructions of the maxillofacial structures were also generated. RADIATION DOSE REDUCTION: This exam was performed according to the departmental dose-optimization program which includes automated exposure control, adjustment of the mA and/or kV according to patient size and/or use of iterative reconstruction technique. COMPARISON:  MRI 09/03/2022. FINDINGS: CT HEAD FINDINGS Brain: No acute intracranial hemorrhage. No CT evidence of acute infarct. No edema, mass effect, or midline shift. The basilar cisterns are patent. Ventricles: The ventricles are normal. Vascular: No hyperdense vessel or unexpected  calcification. Skull: No acute or aggressive finding. Other: Mastoid air cells are clear. CT MAXILLOFACIAL FINDINGS Osseous: No fracture or mandibular dislocation. No destructive process. Orbits: Globes are intact. Lenses are normally located. Extraocular muscles and optic nerve sheath complexes are unremarkable. Normal appearance of the intraorbital fat. Sinuses: Mucosal thickening in the alveolar recesses of the maxillary sinuses, left greater than right. Additional mild mucosal thickening in the posterior right maxillary sinus. Mild scattered mucosal thickening in the ethmoid sinuses. No air-fluid levels. There is pneumatization of the optic struts and bilateral anterior clinoid processes. Soft tissues: The visualized facial soft tissues are without acute abnormality. IMPRESSION: No CT evidence of acute intracranial abnormality. Unremarkable appearance of the visualized maxillofacial bones. Electronically Signed   By: Denny Flack M.D.   On: 06/10/2023 15:43    Procedures Procedures    Medications Ordered in ED Medications  gadobutrol  (GADAVIST ) 1 MMOL/ML injection 8.5 mL (8.5 mLs Intravenous Contrast Given 06/10/23 1755)  predniSONE (DELTASONE) tablet 60 mg (60 mg Oral Given 06/10/23 1954)    ED Course/ Medical Decision Making/ A&P                                 Medical Decision Making Amount and/or Complexity of Data Reviewed Radiology: ordered.  Risk Prescription drug management.   68 yo M with a chief complaints of left-sided facial pain numbness and no weakness.  Patient had a history of squamous cell carcinoma to the left side of his face.  This was removed a couple years ago and since then has had progressive symptoms.  Weakness is new as of about 48 hours ago.  He said that he was washing his face and he suddenly noticed that he had an asymmetric smile.  He hoped that it would just go away.  Ended up calling his ENT today who encouraged him to come to the ED for evaluation.  He  does have some visual symptoms on that left side but seem to improve throughout the morning.  Clinically he has Bell's palsy with the exception that he has some slowing to his extraocular motion with the left eye.  Through the triage process the patient had a CT of the head and face which both are unremarkable.  Will obtain an MRI of the brain and orbit.  MRI without stroke.  No obvious acute orbital finding.  Discussed with patient.  Treat as Bell's.  8:52 PM:  I have discussed the diagnosis/risks/treatment options with the patient.  Evaluation and diagnostic testing in the emergency department does not suggest an emergent condition requiring admission or immediate intervention beyond what has been performed at this time.  They will follow up with PCP, optho, neuro. We also discussed returning to the ED immediately if new or worsening sx occur. We discussed the sx which are most concerning (e.g., sudden worsening pain, fever, inability to tolerate  by mouth) that necessitate immediate return. Medications administered to the patient during their visit and any new prescriptions provided to the patient are listed below.  Medications given during this visit Medications  gadobutrol  (GADAVIST ) 1 MMOL/ML injection 8.5 mL (8.5 mLs Intravenous Contrast Given 06/10/23 1755)  predniSONE (DELTASONE) tablet 60 mg (60 mg Oral Given 06/10/23 1954)     The patient appears reasonably screen and/or stabilized for discharge and I doubt any other medical condition or other [facility] requiring further screening, evaluation, or treatment in the ED at this time prior to discharge.          Final Clinical Impression(s) / ED Diagnoses Final diagnoses:  Bell's palsy    Rx / DC Orders ED Discharge Orders          Ordered    valACYclovir (VALTREX) 1000 MG tablet  3 times daily        06/10/23 1930    predniSONE (DELTASONE) 20 MG tablet        06/10/23 1930              Albertus Hughs, DO 06/10/23 2052

## 2023-06-10 NOTE — ED Triage Notes (Signed)
 Pt here for left sided facial numbness and headache started 2 years ago and progressively getting worse. Hx of TMJ left sided abscess. Denies blurred vision and denies extremity numbness and tingling.

## 2023-06-10 NOTE — ED Notes (Signed)
 Patient ambulated to the restroom

## 2023-06-11 ENCOUNTER — Telehealth: Payer: Self-pay | Admitting: *Deleted

## 2023-06-11 NOTE — Telephone Encounter (Signed)
 Pt called regarding which pharmacy Rx was e-scribed to.  RNCM reviewed chart to access After Visit Summary and found that Rx was printed. Pt request Rx be called in to pharmacy on file ([facility]).  RNCM called in Rx as requested.  RNCM returned call to pt and advised pt to pick up at his convenience.  No additional RNCM needs identified at this time.  Jewelene Mairena J. Rachel Budds, BSN, RN, NCM  Transitions of Care  Nurse Case Manager  [facility] Emergency Departments  Operative Services  [phone]

## 2023-10-05 ENCOUNTER — Telehealth: Payer: Self-pay | Admitting: Cardiovascular Disease

## 2023-10-05 NOTE — Telephone Encounter (Signed)
 Patient will be starting radiation/chemotherapy October 1st and is planning to get a flu/shingles/COVID vaccine prior to starting treatment.  Patient would like to know if Dr. Verlin has any cardiac-related concerns about this.   Patient is also overdue for F/U and requesting appt with Dr. Verlin prior to October 1st if at all possible.  Will forward to Dr. Verlin and his nurse.

## 2023-10-05 NOTE — Telephone Encounter (Signed)
 Pt is starting radiation chemotherapy on October 1st on head and would like to know if Dr. Verlin has any concerns that would need to be addressed before he starts treatment. Pt will also be getting multiple vaccines and wants to know what Dr. Verlin thinks about pt receiving the COVID vaccine due to the risk the vaccine can sometimes pose on the heart, especially with patient's immune system being weaker.

## 2023-10-07 NOTE — Telephone Encounter (Signed)
 I spoke with patient.  Patient had echo in July 2024 but has not had office visit since July 2023.  Appointment made for patient to see Hao Meng, PA on 9/24 at 10:30. He is asking if Dr Verlin has any concerns about him starting chemo and radiation and receiving vaccines listed below.  Patient is not having any chest pain or shortness of breath

## 2023-10-14 ENCOUNTER — Encounter: Payer: Self-pay | Admitting: Physician Assistant

## 2023-10-14 ENCOUNTER — Ambulatory Visit: Attending: Physician Assistant | Admitting: Physician Assistant

## 2023-10-14 VITALS — BP 142/78 | HR 67 | Ht 74.0 in | Wt 188.6 lb

## 2023-10-14 DIAGNOSIS — R030 Elevated blood-pressure reading, without diagnosis of hypertension: Secondary | ICD-10-CM

## 2023-10-14 DIAGNOSIS — C44329 Squamous cell carcinoma of skin of other parts of face: Secondary | ICD-10-CM | POA: Insufficient documentation

## 2023-10-14 DIAGNOSIS — I10 Essential (primary) hypertension: Secondary | ICD-10-CM | POA: Insufficient documentation

## 2023-10-14 DIAGNOSIS — I351 Nonrheumatic aortic (valve) insufficiency: Secondary | ICD-10-CM | POA: Diagnosis present

## 2023-10-14 NOTE — Progress Notes (Unsigned)
 Cardiology Office Note   Date:  10/16/2023  ID:  Nassim, Cosma 1955-07-10, MRN [id number] PCP: Shayne Anes, MD  [facility] Providers Cardiologist:  Lonni Cash, MD     History of Present Illness CLARICE BONAVENTURE is a 68 y.o. male with PMH of hypertension, aortic valve insufficiency, bradycardia and lower extremity edema.  Patient was established with Dr. Cash as a new patient on 08/19/2021 for evaluation of leg edema and valvular disease.  He had a echocardiogram prior to the visit in June 2023 that showed EF 60 to 65%, moderate dilatation of the LV, normal RV size and function, mild MR, mild to moderate aortic insufficiency.  Heart rate was 48 at the time.  He was given 20 mg as needed dose of Lasix  for leg edema.  It was suspected his leg edema is more likely due to be due to venous insufficiency.  He has been followed by oncology service for cutaneous squamous cell carcinoma stage III that was diagnosed in 2023.  He underwent Mohs excision of the tumor on the left face in September 2023.  He has been under surveillance since.  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  PET/CT in September 2025 showed no definitive distant metastasis.  Patient presents today for follow-up.  He is anticipating chemoradiation therapy given the recent finding.  He denies any exertional chest pain or shortness of breath.  Leg edema is very well-controlled on hydrochlorothiazide.  He used to be on ARB but hydrochlorothiazide works better to help control the leg edema.  He has not required any Lasix .  He has no lower extremity edema, orthopnea or PND.  Overall, he is very much stable from the cardiac perspective.  He wished to have COVID-19 vaccine prior to chemoradiation therapy, I do not think there is contraindication from the cardiac perspective.  I certainly do not think COVID-19 vaccine will  have any impact on his leaky valve.  I recommend a repeat echocardiogram.  His blood pressure has been in the 130s to 140s systolic at home.  Both the patient and I hesitant to add a second blood pressure medication at this point as he is about to start on chemoradiation therapy.  He is aware to continue to monitor his blood pressure, if his systolic blood pressures persistently over 140 mmHg, I will consider adding a second blood pressure medication.  Otherwise, he can follow-up with Dr. Cash in 1 year.  ROS:   He denies chest pain, palpitations, dyspnea, pnd, orthopnea, n, v, dizziness, syncope, edema, weight gain, or early satiety. All other systems reviewed and are otherwise negative except as noted above.    Studies Reviewed EKG Interpretation Date/Time:  Wednesday October 14 2023 10:34:01 EDT Ventricular Rate:  67 PR Interval:  172 QRS Duration:  96 QT Interval:  392 QTC Calculation: 414 R Axis:   5  Text Interpretation: Normal sinus rhythm with sinus arrhythmia Normal ECG No previous ECGs available Confirmed by Janene Boer [phone]) on 10/14/2023 11:07:54 AM     Risk Assessment/Calculations          Physical Exam VS:  BP (!) 142/78   Pulse 67   Ht 6' 2 (1.88 m)   Wt 188 lb 9.6 oz (85.5 kg)   SpO2 96%   BMI 24.21 kg/m        Wt Readings from Last 3 Encounters:  10/14/23 188 lb 9.6  oz (85.5 kg)  06/10/23 189 lb (85.7 kg)  12/27/22 197 lb 15.6 oz (89.8 kg)    GEN: Well nourished, well developed in no acute distress NECK: No JVD; No carotid bruits CARDIAC: RRR, no murmurs, rubs, gallops RESPIRATORY:  Clear to auscultation without rales, wheezing or rhonchi  ABDOMEN: Soft, non-tender, non-distended EXTREMITIES:  No edema; No deformity   ASSESSMENT AND PLAN  Aortic valve insufficiency: Will repeat echocardiogram.  Mild to moderate aortic insufficiency on last echocardiogram.  Hypertension: Blood pressure borderline elevated.  We recommended hold off on adjusting  blood pressure medication for now and monitor.  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  Patient is going for chemo and radiation therapy.        Dispo: Follow-up with Dr. Verlin in 1 year  Signed, Laker Thompson, [place]

## 2023-10-14 NOTE — Patient Instructions (Addendum)
 Thank you for choosing [facility]!     Medication Instructions:  Monitor blood pressure daily. If your blood pressure (top numbers) is consistently over 140 and higher, contact the office for blood pressure medication titration.   *If you need a refill on your cardiac medications before your next appointment, please call your pharmacy*   Lab Work: No labs were ordered during today's visit.  If you have labs (blood work) drawn today and your tests are completely normal, you will receive your results only by: MyChart Message (if you have MyChart) OR A paper copy in the mail If you have any lab test that is abnormal or we need to change your treatment, we will call you to review the results.   Testing/Procedures: Your physician has requested that you have an echocardiogram. Echocardiography is a painless test that uses sound waves to create images of your heart. It provides your doctor with information about the size and shape of your heart and how well your heart's chambers and valves are working. This procedure takes approximately one hour. There are no restrictions for this procedure. Please do NOT wear cologne, perfume, aftershave, or lotions (deodorant is allowed). Please arrive 15 minutes prior to your appointment time.  Please note: We ask at that you not bring children with you during ultrasound (echo/ vascular) testing. Due to room size and safety concerns, children are not allowed in the ultrasound rooms during exams. Our front office staff cannot provide observation of children in our lobby area while testing is being conducted. An adult accompanying a patient to their appointment will only be allowed in the ultrasound room at the discretion of the ultrasound technician under special circumstances. We apologize for any inconvenience.   Your next appointment:   1 year(s)   Provider:   Medford Cash, MD. A letter will be mailed to you as a reminder to call the office  for your next follow up appointment.    Follow-Up: At [facility], you and your health needs are our priority.  As part of our continuing mission to provide you with exceptional heart care, we have created designated Provider Care Teams.  These Care Teams include your primary Cardiologist (physician) and Advanced Practice Providers (APPs -  Physician Assistants and Nurse Practitioners) who all work together to provide you with the care you need, when you need it. We recommend signing up for the patient portal called MyChart.  Sign up information is provided on this After Visit Summary.  MyChart is used to connect with patients for Virtual Visits (Telemedicine).  Patients are able to view lab/test results, encounter notes, upcoming appointments, etc.  Non-urgent messages can be sent to your provider as well.   To learn more about what you can do with MyChart, go to [email or web address].

## 2023-12-14 ENCOUNTER — Ambulatory Visit (HOSPITAL_COMMUNITY)
Admission: RE | Admit: 2023-12-14 | Discharge: 2023-12-14 | Disposition: A | Discharge status: Home / Self Care | Source: Ambulatory Visit | Attending: Cardiovascular Disease | Admitting: Cardiovascular Disease

## 2023-12-14 ENCOUNTER — Ambulatory Visit: Payer: Self-pay | Admitting: Physician Assistant

## 2023-12-14 DIAGNOSIS — I351 Nonrheumatic aortic (valve) insufficiency: Secondary | ICD-10-CM | POA: Insufficient documentation

## 2023-12-14 LAB — ECHOCARDIOGRAM COMPLETE
Area-P 1/2: 3.37 cm2
P 1/2 time: 1035 ms
S' Lateral: 4.1 cm

## 2023-12-14 NOTE — Progress Notes (Signed)
 Normal pumping function of heart, no regional wall motion abnormality, normal right chamber pressure. 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
